# Patient Record
Sex: Male | Born: 1960 | Race: White | Hispanic: No | Marital: Single | Smoking: Current every day smoker
Health system: Southern US, Community
[De-identification: ages and names within clinical notes are randomized; demographics above are authoritative.]

## PROBLEM LIST (undated history)

## (undated) DIAGNOSIS — Z973 Presence of spectacles and contact lenses: Secondary | ICD-10-CM

## (undated) DIAGNOSIS — E785 Hyperlipidemia, unspecified: Secondary | ICD-10-CM

## (undated) DIAGNOSIS — J189 Pneumonia, unspecified organism: Secondary | ICD-10-CM

## (undated) DIAGNOSIS — I2510 Atherosclerotic heart disease of native coronary artery without angina pectoris: Secondary | ICD-10-CM

## (undated) DIAGNOSIS — J449 Chronic obstructive pulmonary disease, unspecified: Secondary | ICD-10-CM

## (undated) DIAGNOSIS — R0600 Dyspnea, unspecified: Secondary | ICD-10-CM

## (undated) DIAGNOSIS — F419 Anxiety disorder, unspecified: Secondary | ICD-10-CM

## (undated) DIAGNOSIS — IMO0001 Reserved for inherently not codable concepts without codable children: Secondary | ICD-10-CM

## (undated) DIAGNOSIS — K219 Gastro-esophageal reflux disease without esophagitis: Secondary | ICD-10-CM

## (undated) DIAGNOSIS — K029 Dental caries, unspecified: Secondary | ICD-10-CM

## (undated) DIAGNOSIS — I219 Acute myocardial infarction, unspecified: Secondary | ICD-10-CM

## (undated) DIAGNOSIS — I10 Essential (primary) hypertension: Secondary | ICD-10-CM

## (undated) DIAGNOSIS — I499 Cardiac arrhythmia, unspecified: Secondary | ICD-10-CM

## (undated) DIAGNOSIS — W3400XA Accidental discharge from unspecified firearms or gun, initial encounter: Secondary | ICD-10-CM

## (undated) DIAGNOSIS — Z8673 Personal history of transient ischemic attack (TIA), and cerebral infarction without residual deficits: Secondary | ICD-10-CM

## (undated) DIAGNOSIS — I639 Cerebral infarction, unspecified: Secondary | ICD-10-CM

## (undated) DIAGNOSIS — R319 Hematuria, unspecified: Secondary | ICD-10-CM

## (undated) DIAGNOSIS — M25569 Pain in unspecified knee: Secondary | ICD-10-CM

## (undated) DIAGNOSIS — N401 Enlarged prostate with lower urinary tract symptoms: Secondary | ICD-10-CM

## (undated) DIAGNOSIS — R0609 Other forms of dyspnea: Secondary | ICD-10-CM

## (undated) DIAGNOSIS — Z87828 Personal history of other (healed) physical injury and trauma: Secondary | ICD-10-CM

## (undated) DIAGNOSIS — C679 Malignant neoplasm of bladder, unspecified: Secondary | ICD-10-CM

## (undated) DIAGNOSIS — R200 Anesthesia of skin: Secondary | ICD-10-CM

## (undated) HISTORY — PX: CARDIAC CATHETERIZATION: SHX172

## (undated) HISTORY — PX: INGUINAL HERNIA REPAIR: SUR1180

## (undated) HISTORY — PX: LEG SURGERY: SHX1003

## (undated) HISTORY — PX: SHOULDER ARTHROSCOPY: SHX128

## (undated) HISTORY — PX: HERNIA REPAIR: SHX51

## (undated) HISTORY — DX: Atherosclerotic heart disease of native coronary artery without angina pectoris: I25.10

## (undated) HISTORY — PX: EYE SURGERY: SHX253

## (undated) HISTORY — DX: Anxiety disorder, unspecified: F41.9

## (undated) HISTORY — DX: Hyperlipidemia, unspecified: E78.5

## (undated) HISTORY — DX: Chronic obstructive pulmonary disease, unspecified: J44.9

## (undated) HISTORY — DX: Cerebral infarction, unspecified: I63.9

## (undated) SURGERY — Surgical Case
Anesthesia: *Unknown

---

## 1978-01-05 HISTORY — PX: LEG SURGERY: SHX1003

## 2001-11-24 ENCOUNTER — Inpatient Hospital Stay (HOSPITAL_COMMUNITY): Admission: AD | Admit: 2001-11-24 | Discharge: 2001-11-25 | Payer: Self-pay | Admitting: Cardiology

## 2002-05-01 ENCOUNTER — Inpatient Hospital Stay (HOSPITAL_COMMUNITY): Admission: AD | Admit: 2002-05-01 | Discharge: 2002-05-02 | Payer: Self-pay | Admitting: Cardiology

## 2002-05-02 ENCOUNTER — Encounter: Payer: Self-pay | Admitting: Cardiology

## 2003-06-29 ENCOUNTER — Inpatient Hospital Stay (HOSPITAL_COMMUNITY): Admission: AD | Admit: 2003-06-29 | Discharge: 2003-07-03 | Payer: Self-pay | Admitting: Cardiology

## 2004-03-13 ENCOUNTER — Ambulatory Visit: Payer: Self-pay | Admitting: Cardiology

## 2004-10-15 ENCOUNTER — Ambulatory Visit: Payer: Self-pay | Admitting: Cardiology

## 2007-01-06 HISTORY — PX: CARDIAC CATHETERIZATION: SHX172

## 2008-07-06 ENCOUNTER — Emergency Department (HOSPITAL_COMMUNITY): Admission: EM | Admit: 2008-07-06 | Discharge: 2008-07-06 | Payer: Self-pay | Admitting: Emergency Medicine

## 2009-02-20 ENCOUNTER — Ambulatory Visit: Payer: Self-pay | Admitting: Cardiology

## 2010-04-13 LAB — PROTIME-INR: Prothrombin Time: 14.5 seconds (ref 11.6–15.2)

## 2010-04-13 LAB — DIFFERENTIAL
Basophils Absolute: 0.1 10*3/uL (ref 0.0–0.1)
Lymphocytes Relative: 26 % (ref 12–46)
Neutro Abs: 6.5 10*3/uL (ref 1.7–7.7)

## 2010-04-13 LAB — POCT I-STAT, CHEM 8
BUN: 24 mg/dL — ABNORMAL HIGH (ref 6–23)
Creatinine, Ser: 1.1 mg/dL (ref 0.4–1.5)
Glucose, Bld: 86 mg/dL (ref 70–99)
Hemoglobin: 15.3 g/dL (ref 13.0–17.0)
Potassium: 3.6 mEq/L (ref 3.5–5.1)
Sodium: 141 mEq/L (ref 135–145)
TCO2: 27 mmol/L (ref 0–100)

## 2010-04-13 LAB — CBC
Hemoglobin: 15 g/dL (ref 13.0–17.0)
Platelets: 249 10*3/uL (ref 150–400)
RDW: 13.4 % (ref 11.5–15.5)
WBC: 10.7 10*3/uL — ABNORMAL HIGH (ref 4.0–10.5)

## 2010-05-06 ENCOUNTER — Other Ambulatory Visit: Payer: Self-pay | Admitting: Neurology

## 2010-05-06 DIAGNOSIS — I639 Cerebral infarction, unspecified: Secondary | ICD-10-CM

## 2010-05-13 ENCOUNTER — Ambulatory Visit (HOSPITAL_COMMUNITY)
Admission: RE | Admit: 2010-05-13 | Discharge: 2010-05-13 | Disposition: A | Discharge status: Home / Self Care | Payer: Medicare Other | Source: Ambulatory Visit | Attending: Neurology | Admitting: Neurology

## 2010-05-13 DIAGNOSIS — I639 Cerebral infarction, unspecified: Secondary | ICD-10-CM

## 2010-05-13 DIAGNOSIS — M6281 Muscle weakness (generalized): Secondary | ICD-10-CM | POA: Insufficient documentation

## 2010-05-13 DIAGNOSIS — I10 Essential (primary) hypertension: Secondary | ICD-10-CM | POA: Insufficient documentation

## 2010-05-13 DIAGNOSIS — Z87891 Personal history of nicotine dependence: Secondary | ICD-10-CM | POA: Insufficient documentation

## 2010-05-13 DIAGNOSIS — I6350 Cerebral infarction due to unspecified occlusion or stenosis of unspecified cerebral artery: Secondary | ICD-10-CM | POA: Insufficient documentation

## 2010-05-23 NOTE — Cardiovascular Report (Signed)
NAME:  DIESEL, LINA                        ACCOUNT NO.:  [id number]   MEDICAL RECORD NO.:  [id number]                   PATIENT TYPE:  INP   LOCATION:  4727                                 FACILITY:  [facility]   PHYSICIAN:  Jonelle Sidle, M.D. [facility]        DATE OF BIRTH:  03-16-1960   DATE OF PROCEDURE:  DATE OF DISCHARGE:                              CARDIAC CATHETERIZATION   PRIMARY CARE PHYSICIAN:  Wende Crease, M.D.   Corinda Gubler CARDIOLOGIST:  Jonelle Sidle, M.D.   INDICATIONS FOR PROCEDURE:  The patient is a 50 year old male with a history  of tobacco abuse and significant family history of premature coronary artery  disease.  He was admitted with new onset chest discomfort and underwent a  Cardiolite study which suggested potential inferior wall ischemia with an  ejection fraction of 49%.  He is now referred for definitive evaluation with  a cardiac catheterization to define the coronary anatomy.   PROCEDURE PERFORMED:  1. Left heart catheterization.  2. Selectively coronary angiography.  3. Left ventriculography.   ACCESS AND EQUIPMENT:  The area about the right femoral artery was  anesthetized with 1% lidocaine and a #6 French sheath was placed in the  right femoral artery via the modified Seldinger technique.  Standard  preformed #6 [place] and JR4 catheters were used for selective coronary  angiography and an angled pigtail catheter was used for left heart  catheterization and left ventriculography.  All exchanges were made over a  wire, and the patient tolerated the procedure well without immediate  complications.   HEMODYNAMICS:  1. Left ventricle:  151/20 mmHg.  2. Aorta:  151/68 mmHg.   ANGIOGRAPHIC FINDINGS:  1. The left main coronary artery is free of significant flow-limiting     coronary atherosclerosis.  2. The left anterior descending is a large caliber vessel with two diagonal     branches, the first of which is bifurcating.  There is no  significant     flow-limiting coronary atherosclerosis noted with only minor luminal     irregularities.  3. The circumflex coronary artery is a large dominant vessel with a large     obtuse marginal branch that supplies most of the lateral wall and a     posterior descending branch.  There is no significant flow-limiting     coronary atherosclerosis noted within this system.  4. The right coronary artery is a small nondominant vessel with a 40%     proximal stenosis.  5. Left ventriculography was performed in the RAO projection and revealed an     ejection fraction of 50-55% without focal wall motion abnormalities and     no significant mitral regurgitation.   DIAGNOSES:  1. Nonobstructive coronary atherosclerosis.  There is approximately 40%     stenosis of a small nondominant right coronary artery and only minor     irregularities in the dominant left system.  2. Left ventricular ejection  fraction of 50-55% with no significant mitral     regurgitation.   RECOMMENDATIONS:  Continue risk factor modification but also follow up on  liver function tests, alkaline phosphatase and amylase levels, and continue  proton pump inhibitor.  I furthermore discussed smoking cessation with the  patient.                                               Jonelle Sidle, M.D. [facility]    SGM/MEDQ  D:  11/24/2001  T:  11/24/2001  Job:  [id number]

## 2010-05-23 NOTE — Cardiovascular Report (Signed)
NAME:  Mark Waller, Mark Waller                        ACCOUNT NO.:  [id number]   MEDICAL RECORD NO.:  [id number]                   PATIENT TYPE:  INP   LOCATION:  4734                                 FACILITY:  [facility]   PHYSICIAN:  Arturo Morton. Riley Kill, M.D. [facility]         DATE OF BIRTH:  1960/07/26   DATE OF PROCEDURE:  07/02/2003  DATE OF DISCHARGE:                              CARDIAC CATHETERIZATION   PROCEDURES PERFORMED:  1. Left heart catheterization.  2. Selective coronary arteriography.  3. Selective left ventriculography.   CARDIOLOGIST:  Arturo Morton. Riley Kill, M.D.   INDICATIONS:  Mr. Hanrahan is a 50 year old who presented with chest pain.  The current study was done to assess coronary anatomy.   DESCRIPTION OF THE PROCEDURE:  The patient was quite anxious coming to the  laboratory.  He was prepped and draped in the usual fashion.  Through an  anterior puncture following local anesthesia the right femoral artery was  easily entered and a 6 French sheath was placed.  Central aortic and left  ventricular pressures were measured with a pigtail.  Ventriculography was  performed in the RAO projection.  Coronary arteriography was performed with  standard angiographic projections.   The patient tolerated the procedure well.  He was taken to the holding area  in satisfactory clinical condition.   HEMODYNAMIC DATA:  1. Central aorta 133/73 and mean 94.  2. Left ventricle 118/16.  3. No gradient on pullback across the aortic valve.   Left Main Coronary Artery:  The left main coronary artery was free of  critical disease,   Left Anterior Descending Artery:  The left anterior descending artery  coursed to the apex.  The LAD and its branches were free of significant  disease.  There  were mild luminal irregularity of 20-30% at most in the  midvessel.   Circumflex:  The circumflex provided a large marginal branch and then was a  dominant vessel.  Just after the takeoff of the large marginal  branch was  area of mild segmental irregularity measuring at most 20-30% luminal  reduction.  The AV circumflex was without critical disease.   Right Coronary Artery:  The right coronary artery was a nondominant vessel  and free of critical disease.   VENTRICULOGRAPHIC DATA:  Ventriculography:  Ventriculography was performed  in the RAO projection.  There was borderline mild hypokinesis.  Ejection  fraction was calculated at 58% on the sinus peak.   CONCLUSION:  1. Preserved left ventricular function.  2. Minor luminal irregularities without significant high-grade stenosis.   RECOMMENDATIONS:  1. Discontinue smoking.  2. Discontinue alcohol.  3. Continue to follow up with Dr. Diona Browner and Dr. Doyne Keel.  Arturo Morton. Riley Kill, M.D. [facility]    TDS/MEDQ  D:  07/02/2003  T:  07/02/2003  Job:  [id number]   cc:   [facility] Laboratory   Jonelle Sidle, M.D. [facility]   Elby Beck, M.D.

## 2010-05-23 NOTE — H&P (Signed)
NAME:  Mark Waller, Mark Waller                        ACCOUNT NO.:  [id number]   MEDICAL RECORD NO.:  [id number]                   PATIENT TYPE:  INP   LOCATION:  4737                                 FACILITY:  [facility]   PHYSICIAN:  Creta Levin, M.D. [facility]      DATE OF BIRTH:  06-11-1960   DATE OF ADMISSION:  05/01/2002  DATE OF DISCHARGE:                                HISTORY & PHYSICAL   PRIMARY CARE PHYSICIAN:  Dr. Doyne Keel in [place], [place].   CHIEF COMPLAINT:  Chest pain and shortness of breath.   HISTORY OF PRESENT ILLNESS:  The patient is a 50 year old male with a  history of nonobstructive coronary disease who was in his usual state of  health until 5 o'clock p.m. on April 30, 2002, when he developed 8/10  substernal chest pain that was sharp in quality, radiated to his left upper  extremity, and was associated with dyspnea, nausea, and diaphoresis.  His  symptoms lasted about two and a half hours until he called EMS and was  brought to an outside hospital where he was made pain-free with  nitroglycerin and morphine.  He has had no episodes of chest discomfort  since his hospitalization here in November of 2003 when a catheterization  showed a 40% lesion in a nondominant RCA but otherwise no significant  abnormalities.  He denies orthopnea, PND, or edema.  He has had no  palpitations, presyncope, or syncope.  He does have a baseline dyspnea on  exertion of approximately 100 yards but states that he also needs to stop  because of numbness in his feet.   PAST MEDICAL HISTORY:  CAD.  Catheterization November 2003, revealing a 40%  proximal RCA (nondominant).  There was no disease noted in the left system.  The EF on E-gram was 50 to 55% at that time.   SOCIAL HISTORY:  [place]   HABITS:  He has a 60+ pack-year smoking history, now smokes three packs per  day.  He drinks six to eight beers per week but denies illicit drug use.   FAMILY  HISTORY:  This is significant for an MI in his father at age 28.  His  father died in his 82s.   REVIEW OF SYMPTOMS:  This was positive for headache which was felt prior to  first receiving nitroglycerin.  The chest pain, shortness of breath and  dyspnea on exertion are mentioned in the HPI.  He does have some numbness in  his feet and lower extremities when he walks.  He is also complaining of  some nausea.  Otherwise his full review of systems was noncontributory.   PHYSICAL EXAMINATION:  GENERAL APPEARANCE:  He is an anxious male who  appears older than his stated age but is in no acute distress.  VITAL SIGNS:  Temperature 97.7, blood pressure 122/71, heart rate 42 which  is regular, respiratory rate is 20.  His SAO2  is 97% on room air.  HEENT:  Significant only for poor dentition.  NECK:  No JVD or bruits.  LUNGS:  There were scattered fine crackles and mild wheezing throughout.  CARDIOVASCULAR:  Irregular bradycardia without gallop or murmur.  ABDOMEN:  Soft, nondistended and nontender.  RECTAL:  Heme-negative at the outside hospital.  EXTREMITIES:  No clubbing, cyanosis, or edema.  NEUROLOGIC:  Nonfocal.   Chest x-ray at the outside hospital revealed no active disease.   His ECG at the outside hospital revealed a sinus bradycardia at 54 with a  borderline first degree AV block but no evidence of ischemia.   LABORATORY DATA:  The labs at the outside hospital showed white count 10.2,  hemoglobin 15.3 and hematocrit 44.4, platelets 316.  Sodium 136, potassium  4, chloride 106, bicarb 26, BUN 16, creatinine 0.9, glucose 116.  CK was 74,  MB 0.01.  PTT was 24.3, PT 12.6, INR was 1.0.   ASSESSMENT AND PLAN:  Chest pain.  He has a previous admission with  noncardiac chest pain and what sounds like a pretty similar story.  It is  atypical in that the quality is sharp otherwise it is a pretty reasonable  story for cardiac chest pain.  For this reason, we will continue to monitor  him  on telemetry.  We can continue the heparin that was started at the  outside hospital but I do not feel there is a need for a 2B3A inhibitor at  this time.  We were unable to give a beta-blocker given his bradycardia but  we will attempt to give a low dose of metoprolol if his heart rate comes  back up.  He was on Plavix for reasons that are not entirely clear and  instead of this, we will continue aspirin.  He is not on statin and we can  start this and check this lipid panel.  We can also start an ACE inhibitor.   I discussed smoking cessation with the patient.  He is not interested at  this time.  In fact, he wants to leave the floor to go smoke a cigarette  right now.  We have advised him against this while he is under evaluation  for a possible acute coronary syndrome.  He may decide to go off the floor  against medical advice anyway.  I will give him some Ativan and also place a  Nicotine patch with the hope of reducing his cravings.  Otherwise, we are  going to need a multifactorial approach.                                               Creta Levin, M.D. [facility]    RPK/MEDQ  D:  05/01/2002  T:  05/01/2002  Job:  [id number]   cc:   Dr. Aundra Millet, [place]
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
## 2010-05-23 NOTE — Discharge Summary (Signed)
   NAME:  Mark Waller, Mark Waller                        ACCOUNT NO.:  [id number]   MEDICAL RECORD NO.:  [id number]                   PATIENT TYPE:  INP   LOCATION:  4737                                 FACILITY[facility]  [facility]   PHYSICIAN:  Guy Franco, P.A. [facility]                DATE OF BIRTH:  Jun 07, 1960   DATE OF ADMISSION:  05/01/2002  DATE OF DISCHARGE:  05/02/2002                           DISCHARGE SUMMARY - REFERRING   DISCHARGE DIAGNOSES:  1. Chest discomfort, resolved.  2. Known coronary artery disease, nonobstructive, in November of 2003.  3. Alcohol abuse.  4. Tobacco abuse.  5. Mild elevation of liver function tests.   HOSPITAL COURSE:  Mr. Zurawski is a 50 year old male patient who was admitted  in November of 2003 and underwent cardiac catheterization that revealed  nonobstructive coronary artery disease with a nondominant, 40% proximal RCA  lesion.  He was readmitted on May 01, 2002, with recurrent substernal  chest pain essentially; he underwent an adenosine Cardiolite that revealed  an EF of 51%, essentially revealed no acute abnormalities.  Labs obtained  during the patient's hospital stay included a white count of 9.3, hemoglobin  14.8, hematocrit of 44.6, platelets 302, AST 53, and ALT 69 with all other  CMET results as being normal.  Total cholesterol was 196, triglycerides 65,  HDL 43, LDL 141.  Cardiac isoenzymes negative.   The patient came into the hospital on Plavix, and this was continued; this  had been started as an outpatient, and the patient had been describing dizzy  spells with loss of vision and numbness in the left arm.  Apparently, this  was for some type of stroke prevention as an outpatient.   Once his studies were back, the patient was felt to be ready for discharge  to home, and he was discharged to home in stable condition.  He is to  continue his home medications which include Plavix as well as a baby  aspirin.  May use __________ Tylenol as needed.   Remain on a low-fat, low-salt, low-  cholesterol diet, and he is to follow up with Dr. Doyne Keel in the morning.  He is to call him for a followup appointment.                                               Guy Franco, P.A. [facility]    LB/MEDQ  D:  06/10/2002  T:  06/10/2002  Job:  [id number]   cc:   Sidonie Dickens, M.D. [facility]

## 2010-05-23 NOTE — Discharge Summary (Signed)
NAME:  Mark Waller, Mark Waller                        ACCOUNT NO.:  [id number]   MEDICAL RECORD NO.:  [id number]                   PATIENT TYPE:  INP   LOCATION:  4727                                 FACILITY[facility]  [facility]   PHYSICIAN:  Joellyn Rued, P.A. [facility]              DATE OF BIRTH:  [birth year]   DATE OF ADMISSION:  11/24/2001  DATE OF DISCHARGE:  11/25/2001                           DISCHARGE SUMMARY - REFERRING   HISTORY OF PRESENT ILLNESS:  The patient is a 50 year old white male who  presented to [facility] complaining of sudden onset of chest  tightness that has lasted several hours by his account.  He did not have any  associated shortness of breath but he did describe a somewhat pleuritic  component.  He denied any diaphoresis, nausea, or vomiting.  He has not had  any problems with exertional chest discomfort and he does admit to strenuous  activity.  His history is notable for tobacco use in early family history.  At [facility], ECG showed normal sinus rhythm, sinus bradycardia, and  nonspecific ST-T wave changes.  CK total MBs and troponins were negative for  myocardial infarction.  Triglycerides 190, total cholesterol 190, HDL 41,  LDL 111.  Sodium 141, potassium 4.0, amylase 54, BUN 12, creatinine 1.0,  glucose 90, alkaline phosphatase was slightly elevated at 113, SGOT 84.  Hemoglobin and hematocrit was 15.2 and 44.9, respectively, normal indices,  platelets 352, WBC 14.4.  Alkaline phosphatase repeated on November 21 was  within normal limits at 94.   HOSPITAL COURSE:  The patient was transferred to [facility] for  cardiac catheterization to evaluate his chest discomfort.  He ruled out for  myocardial infarction at [facility].  On November 24, 2001, cardiac  catheterization was performed by Dr. Diona Browner. This revealed an ejection  fraction of 50-55%, no wall motion abnormalities.  He had a very small  nondominant RCA that had a 40% proximal lesion.   The remainder of his  arteries were free from coronary artery disease.  He was started on Protonix  and by November 21 catheterization site was intact and he was ambulating  without difficulty.  Dr. Eden Emms felt that he could be discharged home and  followed up by his primary care physician.   DISCHARGE DIAGNOSES:  1. Noncardiac chest discomfort.  2. Tobacco use.   DISPOSITION:  He is discharged home.   DISCHARGE MEDICATIONS:  1. Protonix 40 mg daily  2. Wellbutrin 150 mg b.i.d. for seven weeks.  3. Baby aspirin 81 mg daily   He is advised no lifting, driving, sexual activity or heavy exertion for two  days.  Maintain low salt, fat cholesterol diet.  If he has any problems with  his catheterization site, he was asked to call our office immediately.  He  was advised no smoking or tobacco products and arranging a two week  appointment with Dr. Doyne Keel to follow  up on cardiac risk factor  modification and establish himself with a primary care MD.                                               Joellyn Rued, P.A. [facility]    EW/MEDQ  D:  11/25/2001  T:  11/25/2001  Job:  [id number]

## 2010-11-14 IMAGING — CT CT HEAD W/O CM
1 of 2 series · 13 of 30 positions shown, 17 images · non-contrast
Comparison: None

CLINICAL DATA: Recent history of TIAs, now with headache and left-
sided numbness and weakness

CT HEAD WITHOUT CONTRAST
TECHNIQUE: Contiguous axial images were obtained from the base of
the skull through the vertex without contrast.

[Series 2: brain · axial · 0.49mm/px · z∈[+138,+262]mm · 13 of 28 slices shown, 17 images]
[im 2/28  brain]
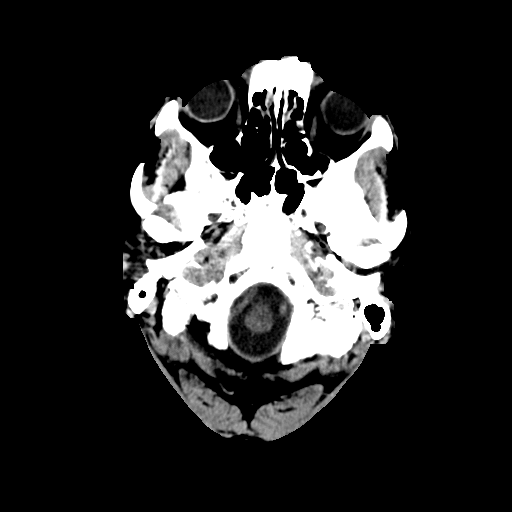
[im 2/28  bone]
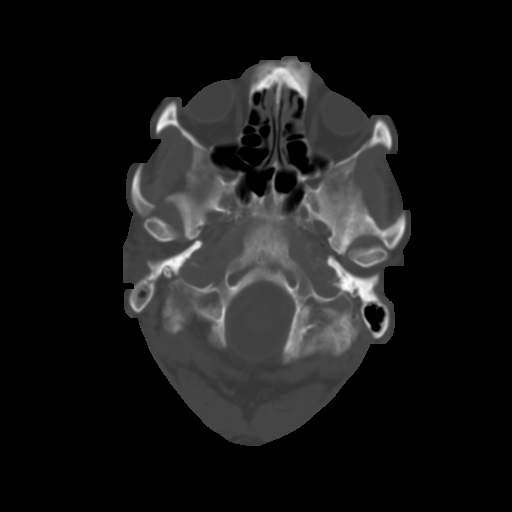
[im 4/28  brain]
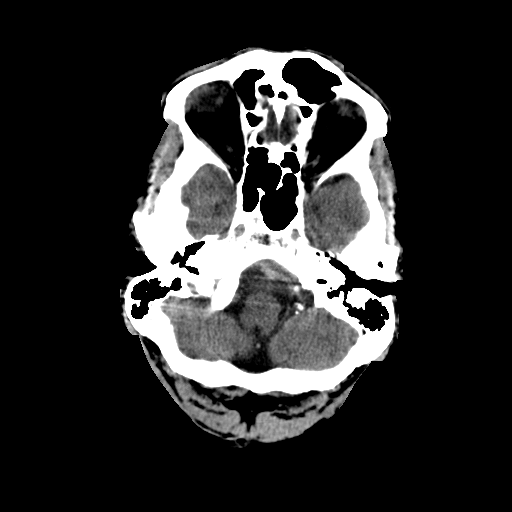
[im 6/28  brain]
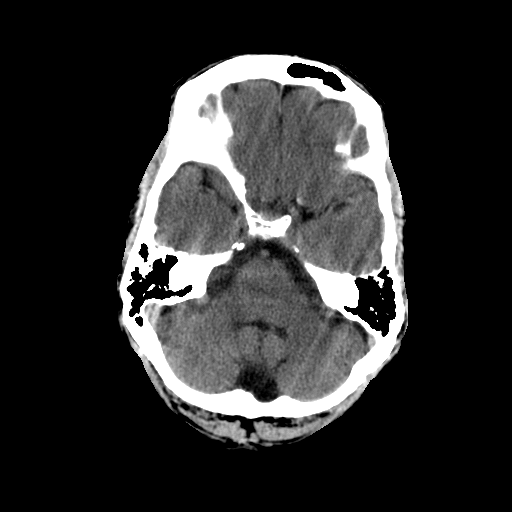
[im 8/28  brain]
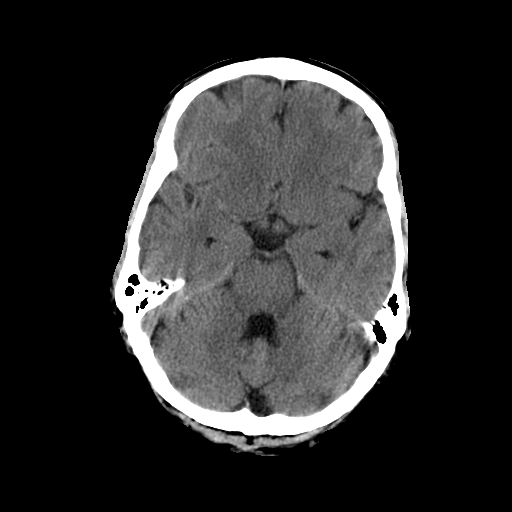
[im 10/28  brain]
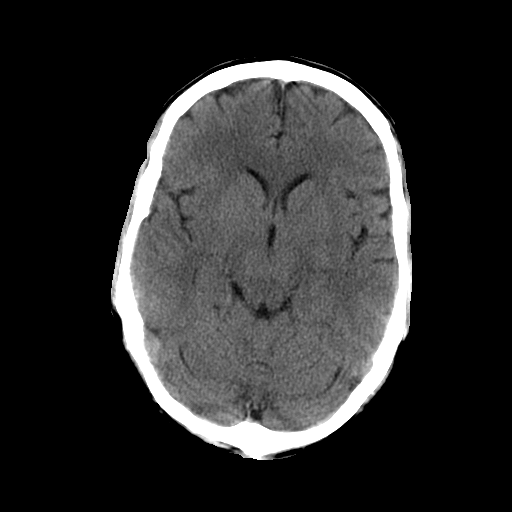
[im 10/28  bone]
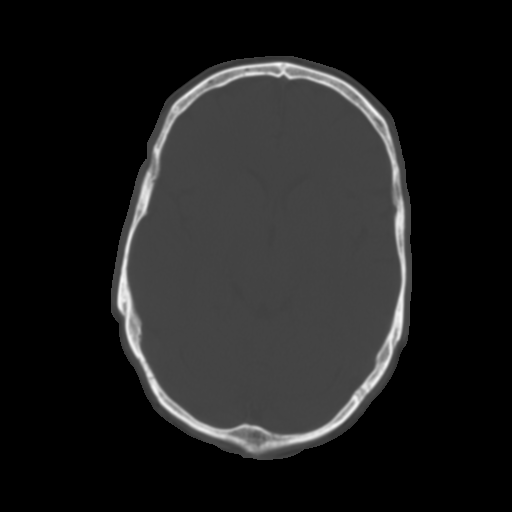
[im 12/28  brain]
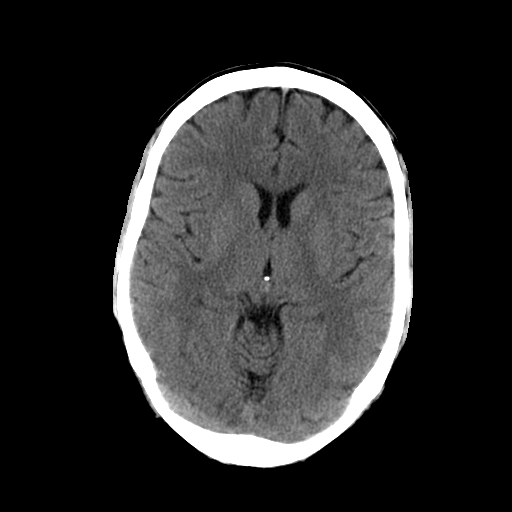
[im 14/28  brain]
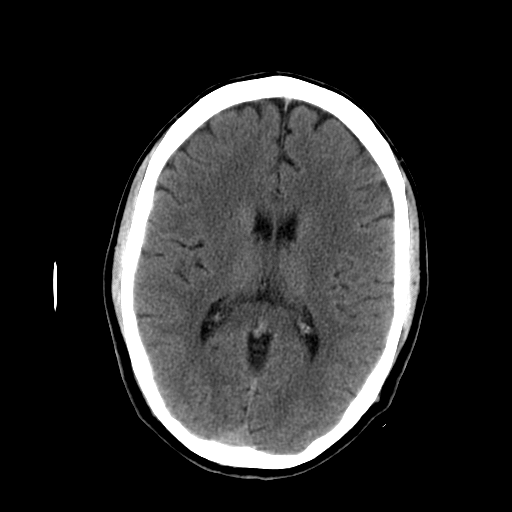
[im 16/28  brain]
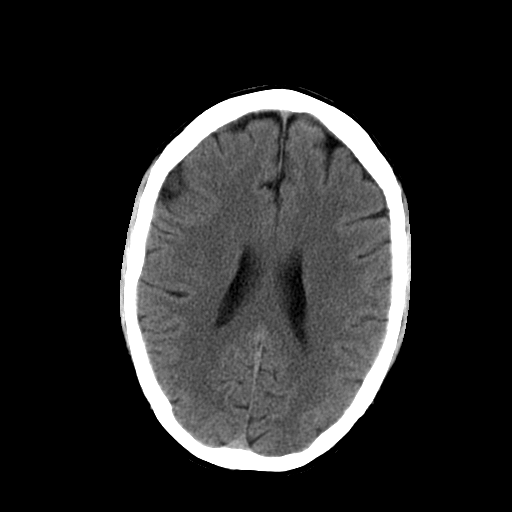
[im 18/28  brain]
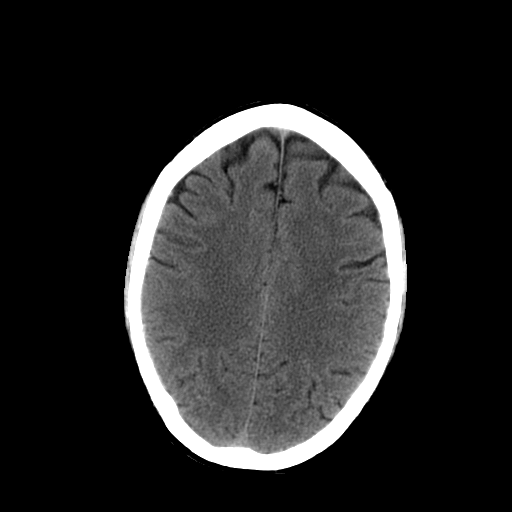
[im 18/28  bone]
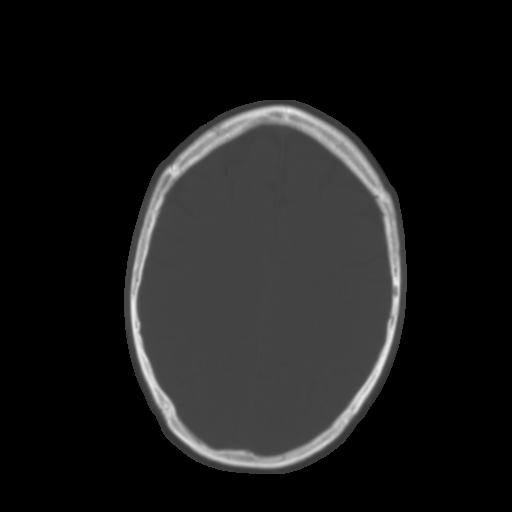
[im 20/28  brain]
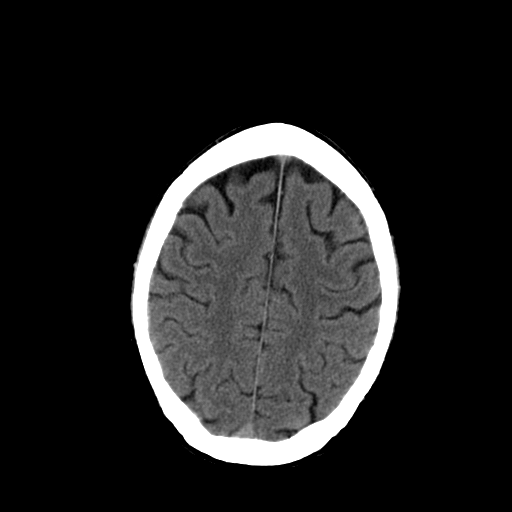
[im 22/28  brain]
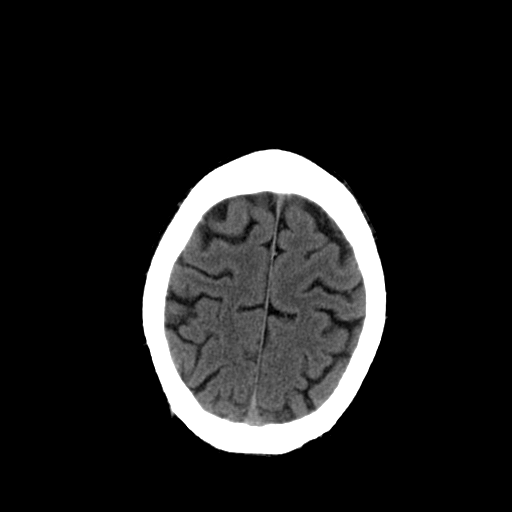
[im 24/28  brain]
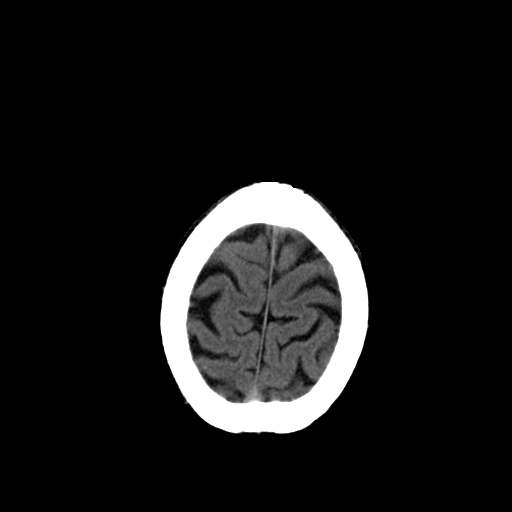
[im 26/28  brain]
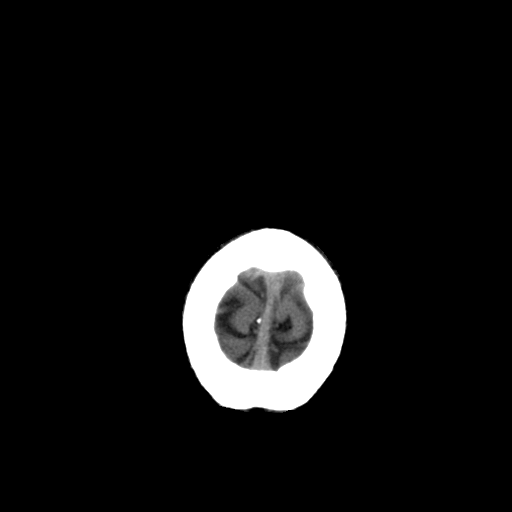
[im 26/28  bone]
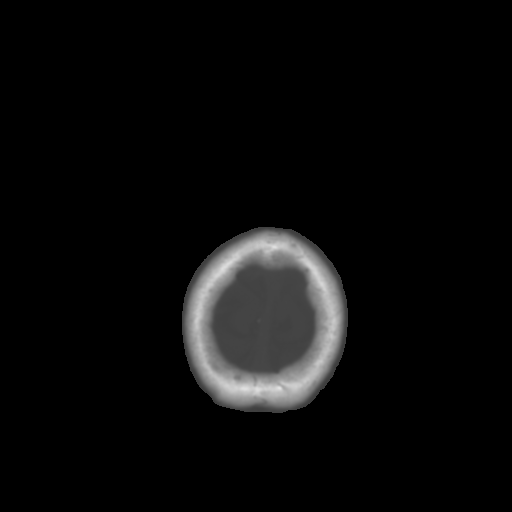

[13 of 30 positions shown; findings below may reference images not displayed]

FINDINGS: The ventricular system is normal in size and
configuration.  The septum is in a normal midline position.  The
fourth ventricle and basilar cisterns appear normal.  No
hemorrhage, mass lesion, or acute infarction is seen.  Mild ethmoid
sinus disease is noted.  No calvarial abnormality is seen.
IMPRESSION: No acute intracranial abnormality.  Mild ethmoid sinus disease.

## 2010-12-05 DIAGNOSIS — R079 Chest pain, unspecified: Secondary | ICD-10-CM

## 2010-12-08 ENCOUNTER — Encounter: Payer: Self-pay | Admitting: Cardiology

## 2010-12-09 ENCOUNTER — Ambulatory Visit (INDEPENDENT_AMBULATORY_CARE_PROVIDER_SITE_OTHER): Payer: Medicare Other | Admitting: Cardiology

## 2010-12-09 ENCOUNTER — Encounter: Payer: Self-pay | Admitting: Cardiology

## 2010-12-09 VITALS — BP 146/80 | HR 79 | Resp 18 | Ht 72.0 in | Wt 174.0 lb

## 2010-12-09 DIAGNOSIS — Z8673 Personal history of transient ischemic attack (TIA), and cerebral infarction without residual deficits: Secondary | ICD-10-CM

## 2010-12-09 DIAGNOSIS — F17200 Nicotine dependence, unspecified, uncomplicated: Secondary | ICD-10-CM

## 2010-12-09 DIAGNOSIS — I2510 Atherosclerotic heart disease of native coronary artery without angina pectoris: Secondary | ICD-10-CM

## 2010-12-09 DIAGNOSIS — E782 Mixed hyperlipidemia: Secondary | ICD-10-CM

## 2010-12-09 DIAGNOSIS — Z720 Tobacco use: Secondary | ICD-10-CM

## 2010-12-09 DIAGNOSIS — I10 Essential (primary) hypertension: Secondary | ICD-10-CM

## 2010-12-09 NOTE — Assessment & Plan Note (Signed)
We discussed the critical importance of smoking cessation.

## 2010-12-09 NOTE — Assessment & Plan Note (Signed)
Blood pressure is mildly elevated today. Continue regular followup with Dr. Loney Hering.

## 2010-12-09 NOTE — Patient Instructions (Signed)
   Echo If the results of your test are normal or stable, you will receive a letter.  If they are abnormal, the nurse will contact you by phone. Your physician wants you to follow up in: 6 months.  You will receive a reminder letter in the mail one-two months in advance.  If you don't receive a letter, please call our office to schedule the follow up appointment  

## 2010-12-09 NOTE — Progress Notes (Signed)
Clinical Summary Mark Waller is a 50 y.o.male referred for cardiology evaluation by Dr. Loney Hering. He was seen by our practice several years ago, underwent previous cardiac catheterizations demonstrating only mild nonobstructive disease.  Records indicate recent ER visit at [facility] with chest pain, also some right upper extremity numbness, headache. He was noted to be hemodynamically stable. 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 No new stroke identified.Mark Waller His chest pain was described as noncardiac.  Dr. Loney Hering referred him for a followup Lexiscan Cardiolite on 11/30 which demonstrated no diagnostic ST changes, probable attenuation artifact affecting the inferior and apical distribution, LVEF 54%, but increased end-diastolic volume. No significant TID noted.  Mark Waller states that he was previously followed by a neurosurgeon at [facility]. He states he was told that he could not take aspirin.  He denies any further chest pain symptoms since November. Has NYHA class II dyspnea on exertion, no orthopnea or PND.  Continues to smoke, and we discussed smoking cessation. We also reviewed his stress test results, his known diagnosis of CAD based on previous testing, and further risk factor modification strategies including blood pressure and lipid control.   Allergies  Allergen Reactions  . Penicillins Rash    Medication list reviewed.  Past Medical History  Diagnosis Date  . Coronary atherosclerosis of native coronary artery     Mild nonobstructive disease at catheterization 2005  . Hyperlipidemia   . Anxiety   . COPD (chronic obstructive pulmonary disease)   . Stroke     History of prior right medullary hemorrhage - previously followed at [facility]    Past Surgical History  Procedure Date  . Hernia repair   . Leg surgery     Family History    Problem Relation Age of Onset  . Coronary artery disease Father   . Coronary artery disease Sister     Social History Mark Waller reports that he has been smoking Cigarettes.  He has never used smokeless tobacco. Mark Waller reports that he does not drink alcohol.  Review of Systems No reported palpitations. Occasional dizziness and headaches. No syncope. Reports stable appetite. No obvious claudication. Otherwise negative.  Physical Examination Filed Vitals:   12/09/10 1304  BP: 146/80  Pulse: 79  Resp: 18   Chronically ill-appearing male, in no acute distress, no active chest pain. HEENT: Conjunctiva and lids normal, oropharynx with poor dentition. Neck: Supple, no elevated JVP or carotid bruits. No thyromegaly. Lungs: Clear to auscultation, nonlabored. Decreased overall breath sounds. Cardiac: Regular rate and rhythm, S4 noted, soft systolic murmur, no S3 or rub. Abdomen: Soft, nontender, no bruits. Skin: Warm and dry. Extremities: No pitting edema, distal pulses 1-2+. Musculoskeletal: No gross deformities. Neuropsychiatric: Alert and oriented x3, moves all extremities equally.     Problem List and Plan

## 2010-12-09 NOTE — Assessment & Plan Note (Signed)
Limited information suggests prior history of spontaneous right medullary hemorrhagic stroke, previously followed at [facility]. Records will be requested. Patient states he was told that he could not take aspirin.

## 2010-12-09 NOTE — Assessment & Plan Note (Signed)
On statin therapy, followed by Dr. Loney Hering. LDL should be under 100 at least.

## 2010-12-09 NOTE — Assessment & Plan Note (Signed)
Patient with history of nonobstructive disease based on prior cardiac catheterization, with recent Cardiolite demonstrating no frank ischemia, probable attenuation artifact affecting the inferior wall and apex. LVEF was 54%. He has had no further chest pain symptoms since November. We discussed observation, warning signs, and risk factor modification. Cardiolite did suggest left ventricular dilatation at baseline, and we will plan a 2-D echocardiogram to better assess cardiac structure and function. Aspirin is not being started with apparent history of previous spontaneous hemorrhagic stroke.

## 2010-12-10 ENCOUNTER — Encounter: Payer: Self-pay | Admitting: *Deleted

## 2010-12-10 ENCOUNTER — Other Ambulatory Visit: Payer: Medicare Other | Admitting: *Deleted

## 2010-12-17 DIAGNOSIS — R079 Chest pain, unspecified: Secondary | ICD-10-CM

## 2011-01-01 ENCOUNTER — Other Ambulatory Visit: Payer: Medicare Other | Admitting: *Deleted

## 2011-01-06 HISTORY — PX: COLONOSCOPY W/ POLYPECTOMY: SHX1380

## 2011-01-23 ENCOUNTER — Ambulatory Visit (INDEPENDENT_AMBULATORY_CARE_PROVIDER_SITE_OTHER): Payer: Medicare Other | Admitting: Cardiology

## 2011-01-23 ENCOUNTER — Encounter: Payer: Medicare Other | Admitting: Cardiology

## 2011-01-23 ENCOUNTER — Encounter: Payer: Self-pay | Admitting: Cardiology

## 2011-01-23 VITALS — BP 124/68 | HR 70 | Ht 72.0 in | Wt 175.0 lb

## 2011-01-23 DIAGNOSIS — E782 Mixed hyperlipidemia: Secondary | ICD-10-CM

## 2011-01-23 DIAGNOSIS — I10 Essential (primary) hypertension: Secondary | ICD-10-CM

## 2011-01-23 DIAGNOSIS — I2510 Atherosclerotic heart disease of native coronary artery without angina pectoris: Secondary | ICD-10-CM

## 2011-01-23 NOTE — Assessment & Plan Note (Signed)
Based on most recent cardiac evaluation, plan is to continue medical therapy and observation with previously documented nonobstructive disease at catheterization. Office followup arranged. He is not on aspirin or other antiplatelet therapy with prior history of spontaneous intracranial hemorrhage.

## 2011-01-23 NOTE — Patient Instructions (Signed)
Your physician wants you to follow-up in: 6 months. You will receive a reminder letter in the mail one-two months in advance. If you don't receive a letter, please call our office to schedule the follow-up appointment. Your physician recommends that you continue on your current medications as directed. Please refer to the Current Medication list given to you today. 

## 2011-01-23 NOTE — Assessment & Plan Note (Signed)
Blood pressure is reasonably controlled today. 

## 2011-01-23 NOTE — Progress Notes (Signed)
   Clinical Summary Mark Waller is a 51 y.o.male presenting for followup. He was seen in December. Record review finds admission to [facility] since then with atypical chest pain. He was seen by our practice in consultation and ruled out for MI. Followup echocardiogram showed LVEF 55-60% with normal chamber size, mild aortic regurigtation, mild ascending aortic dilatation (although measurement in report was normal at 33 mm).  He presents today with no complaints of chest pain. He reports compliance with his medications. I reviewed with him the results of his recent cardiac testing during hospitalization. Our plan at this time is to continue observation on medical therapy.   Allergies  Allergen Reactions  . Penicillins Rash    Current Outpatient Prescriptions  Medication Sig Dispense Refill  . budesonide-formoterol (SYMBICORT) 160-4.5 MCG/ACT inhaler Inhale 2 puffs into the lungs 2 (two) times daily.        . diazepam (VALIUM) 5 MG tablet Take 5 mg by mouth 2 (two) times daily as needed.        . folic acid (FOLVITE) 1 MG tablet Take 1 mg by mouth daily.        [name] lisinopril (PRINIVIL,ZESTRIL) 10 MG tablet Take 10 mg by mouth daily.        [name] omeprazole (PRILOSEC) 40 MG capsule Take 40 mg by mouth daily.        . rosuvastatin (CRESTOR) 10 MG tablet Take 10 mg by mouth daily.        [name] tiotropium (SPIRIVA) 18 MCG inhalation capsule Place 18 mcg into inhaler and inhale daily.        . vitamin B-12 (CYANOCOBALAMIN) 1000 MCG tablet Take 1,000 mcg by mouth daily.          Past Medical History  Diagnosis Date  . Coronary atherosclerosis of native coronary artery     Mild nonobstructive disease at catheterization 2005  . Hyperlipidemia   . Anxiety   . COPD (chronic obstructive pulmonary disease)   . Stroke     History of prior right medullary hemorrhage - previously followed at [facility]    Social History Mark Waller reports that he has been smoking Cigarettes.  He has a 10.5 pack-year smoking  history. He has never used smokeless tobacco. Mark Waller reports that he does not drink alcohol.  Review of Systems No palpitations or syncope. Stable appetite. No reported bleeding problems. Otherwise negative.  Physical Examination Filed Vitals:   01/23/11 1519  BP: 124/68  Pulse: 70   Chronically ill-appearing male, in no acute distress, no active chest pain.  HEENT: Conjunctiva and lids normal, oropharynx with poor dentition.  Neck: Supple, no elevated JVP or carotid bruits. No thyromegaly.  Lungs: Clear to auscultation, nonlabored. Decreased overall breath sounds.  Cardiac: Regular rate and rhythm, S4 noted, soft systolic murmur, no S3 or rub.  Abdomen: Soft, nontender, no bruits.  Skin: Warm and dry.  Extremities: No pitting edema, distal pulses 1-2+.     Problem List and Plan

## 2011-01-23 NOTE — Assessment & Plan Note (Signed)
Continued on statin therapy.  ?

## 2011-05-11 ENCOUNTER — Encounter (HOSPITAL_COMMUNITY): Payer: Self-pay | Admitting: Respiratory Therapy

## 2011-05-14 ENCOUNTER — Encounter (HOSPITAL_COMMUNITY)
Admission: RE | Admit: 2011-05-14 | Discharge: 2011-05-14 | Disposition: A | Discharge status: Home / Self Care | Payer: Medicare Other | Source: Ambulatory Visit | Attending: Oral Surgery | Admitting: Oral Surgery

## 2011-05-14 ENCOUNTER — Encounter (HOSPITAL_COMMUNITY): Payer: Self-pay

## 2011-05-14 ENCOUNTER — Encounter (HOSPITAL_COMMUNITY)
Admission: RE | Admit: 2011-05-14 | Discharge: 2011-05-14 | Disposition: A | Discharge status: Home / Self Care | Payer: Medicare Other | Source: Ambulatory Visit | Attending: Anesthesiology | Admitting: Anesthesiology

## 2011-05-14 HISTORY — DX: Essential (primary) hypertension: I10

## 2011-05-14 HISTORY — DX: Dental caries, unspecified: K02.9

## 2011-05-14 HISTORY — DX: Reserved for inherently not codable concepts without codable children: IMO0001

## 2011-05-14 HISTORY — DX: Acute myocardial infarction, unspecified: I21.9

## 2011-05-14 HISTORY — DX: Gastro-esophageal reflux disease without esophagitis: K21.9

## 2011-05-14 HISTORY — DX: Accidental discharge from unspecified firearms or gun, initial encounter: W34.00XA

## 2011-05-14 HISTORY — DX: Pain in unspecified knee: M25.569

## 2011-05-14 LAB — CBC
HCT: 46.8 % (ref 39.0–52.0)
Hemoglobin: 16 g/dL (ref 13.0–17.0)
MCH: 33.5 pg (ref 26.0–34.0)
MCHC: 34.2 g/dL (ref 30.0–36.0)
MCV: 97.9 fL (ref 78.0–100.0)
RDW: 13.2 % (ref 11.5–15.5)

## 2011-05-14 LAB — BASIC METABOLIC PANEL
BUN: 12 mg/dL (ref 6–23)
Chloride: 102 mEq/L (ref 96–112)
Creatinine, Ser: 0.91 mg/dL (ref 0.50–1.35)
Glucose, Bld: 80 mg/dL (ref 70–99)
Potassium: 3.8 mEq/L (ref 3.5–5.1)

## 2011-05-14 NOTE — Pre-Procedure Instructions (Signed)
[address] Leon[address]  05/14/2011   Your procedure is scheduled on:  Monday, May 13   Report to [facility] at 0830 AM.  Call this number if you have problems the morning of surgery: [phone]   Remember:   Do not eat food:After Midnight.  May have clear liquids: up to 4 Hours before arrival.  Clear liquids include soda, tea, black coffee, apple or grape juice, broth.  Take these medicines the morning of surgery with A SIP OF WATER: *Inhaler if needed, Spiriva, Valium,omeprazole,**   Do not wear jewelry, make-up or nail polish.  Do not wear lotions, powders, or perfumes. You may wear deodorant.  Do not shave 48 hours prior to surgery.  Do not bring valuables to the hospital.  Contacts, dentures or bridgework may not be worn into surgery.  Leave suitcase in the car. After surgery it may be brought to your room.  For patients admitted to the hospital, checkout time is 11:00 AM the day of discharge.   Patients discharged the day of surgery will not be allowed to drive home.  Name and phone number of your driver: *Mother, **  Special Instructions: CHG Shower Use Special Wash: 1/2 bottle night before surgery and 1/2 bottle morning of surgery.   Please read over the following fact sheets that you were given: Pain Booklet, Coughing and Deep Breathing, Surgical Site Infection Prevention and Care and Recovery After Surgery

## 2011-05-15 NOTE — Progress Notes (Signed)
Patient notified of needing to be at short stay 5/13 @ 730am. Montez Morita, Dinah Beers

## 2011-05-15 NOTE — Progress Notes (Signed)
Chart seen clearance reviewed.  Pt appears adequate for planned surgery.

## 2011-05-18 ENCOUNTER — Encounter (HOSPITAL_COMMUNITY): Payer: Self-pay | Admitting: Anesthesiology

## 2011-05-18 ENCOUNTER — Ambulatory Visit (HOSPITAL_COMMUNITY): Payer: Medicare Other | Admitting: Anesthesiology

## 2011-05-18 ENCOUNTER — Encounter (HOSPITAL_COMMUNITY): Payer: Self-pay | Admitting: *Deleted

## 2011-05-18 ENCOUNTER — Ambulatory Visit (HOSPITAL_COMMUNITY)
Admission: RE | Admit: 2011-05-18 | Discharge: 2011-05-18 | Disposition: A | Discharge status: Home / Self Care | Payer: Medicare Other | Source: Ambulatory Visit | Attending: Oral Surgery | Admitting: Oral Surgery

## 2011-05-18 ENCOUNTER — Encounter (HOSPITAL_COMMUNITY)
Admission: RE | Disposition: A | Discharge status: Home / Self Care | Payer: Self-pay | Source: Ambulatory Visit | Attending: Oral Surgery

## 2011-05-18 DIAGNOSIS — J449 Chronic obstructive pulmonary disease, unspecified: Secondary | ICD-10-CM | POA: Insufficient documentation

## 2011-05-18 DIAGNOSIS — F17200 Nicotine dependence, unspecified, uncomplicated: Secondary | ICD-10-CM | POA: Insufficient documentation

## 2011-05-18 DIAGNOSIS — Z01812 Encounter for preprocedural laboratory examination: Secondary | ICD-10-CM | POA: Insufficient documentation

## 2011-05-18 DIAGNOSIS — L729 Follicular cyst of the skin and subcutaneous tissue, unspecified: Secondary | ICD-10-CM

## 2011-05-18 DIAGNOSIS — I2510 Atherosclerotic heart disease of native coronary artery without angina pectoris: Secondary | ICD-10-CM | POA: Insufficient documentation

## 2011-05-18 DIAGNOSIS — I10 Essential (primary) hypertension: Secondary | ICD-10-CM | POA: Insufficient documentation

## 2011-05-18 DIAGNOSIS — M278 Other specified diseases of jaws: Secondary | ICD-10-CM | POA: Insufficient documentation

## 2011-05-18 DIAGNOSIS — Z01818 Encounter for other preprocedural examination: Secondary | ICD-10-CM | POA: Insufficient documentation

## 2011-05-18 DIAGNOSIS — K029 Dental caries, unspecified: Secondary | ICD-10-CM | POA: Insufficient documentation

## 2011-05-18 DIAGNOSIS — L723 Sebaceous cyst: Secondary | ICD-10-CM | POA: Insufficient documentation

## 2011-05-18 DIAGNOSIS — J4489 Other specified chronic obstructive pulmonary disease: Secondary | ICD-10-CM | POA: Insufficient documentation

## 2011-05-18 DIAGNOSIS — Z720 Tobacco use: Secondary | ICD-10-CM

## 2011-05-18 DIAGNOSIS — K1370 Unspecified lesions of oral mucosa: Secondary | ICD-10-CM | POA: Insufficient documentation

## 2011-05-18 DIAGNOSIS — M270 Developmental disorders of jaws: Secondary | ICD-10-CM

## 2011-05-18 DIAGNOSIS — Z8673 Personal history of transient ischemic attack (TIA), and cerebral infarction without residual deficits: Secondary | ICD-10-CM

## 2011-05-18 DIAGNOSIS — E785 Hyperlipidemia, unspecified: Secondary | ICD-10-CM | POA: Insufficient documentation

## 2011-05-18 DIAGNOSIS — E782 Mixed hyperlipidemia: Secondary | ICD-10-CM

## 2011-05-18 DIAGNOSIS — K219 Gastro-esophageal reflux disease without esophagitis: Secondary | ICD-10-CM | POA: Insufficient documentation

## 2011-05-18 HISTORY — PX: LESION REMOVAL: SHX5196

## 2011-05-18 HISTORY — PX: TOOTH EXTRACTION: SHX859

## 2011-05-18 SURGERY — WIDE EXCISION, LESION, UPPER EXTREMITY
Anesthesia: General | Site: Mouth | Laterality: Right | Wound class: Clean

## 2011-05-18 MED ORDER — CEFAZOLIN SODIUM 1-5 GM-% IV SOLN
INTRAVENOUS | Status: AC
  Filled 2011-05-18: qty 50

## 2011-05-18 MED ORDER — GLYCOPYRROLATE 0.2 MG/ML IJ SOLN
INTRAMUSCULAR | Status: DC | PRN
  Administered 2011-05-18: .8 mg via INTRAVENOUS

## 2011-05-18 MED ORDER — CLINDAMYCIN PHOSPHATE 600 MG/4ML IJ SOLN
INTRAMUSCULAR | Status: AC
  Filled 2011-05-18: qty 4

## 2011-05-18 MED ORDER — SODIUM CHLORIDE 0.9 % IR SOLN
Status: DC | PRN
  Administered 2011-05-18: 1000 mL

## 2011-05-18 MED ORDER — NEOSTIGMINE METHYLSULFATE 1 MG/ML IJ SOLN
INTRAMUSCULAR | Status: DC | PRN
  Administered 2011-05-18: 4 mg via INTRAVENOUS

## 2011-05-18 MED ORDER — DEXTROSE 5 % IV SOLN
INTRAVENOUS | Status: AC
  Filled 2011-05-18: qty 50

## 2011-05-18 MED ORDER — MIDAZOLAM HCL 5 MG/5ML IJ SOLN
INTRAMUSCULAR | Status: DC | PRN
  Administered 2011-05-18: 2 mg via INTRAVENOUS

## 2011-05-18 MED ORDER — ONDANSETRON HCL 4 MG/2ML IJ SOLN
INTRAMUSCULAR | Status: DC | PRN
  Administered 2011-05-18: 4 mg via INTRAVENOUS

## 2011-05-18 MED ORDER — CLINDAMYCIN PHOSPHATE 600 MG/50ML IV SOLN
INTRAVENOUS | Status: DC | PRN
  Administered 2011-05-18: 600 mg via INTRAVENOUS

## 2011-05-18 MED ORDER — VECURONIUM BROMIDE 10 MG IV SOLR
INTRAVENOUS | Status: DC | PRN
  Administered 2011-05-18: 5 mg via INTRAVENOUS

## 2011-05-18 MED ORDER — PROPOFOL 10 MG/ML IV EMUL
INTRAVENOUS | Status: DC | PRN
  Administered 2011-05-18: 100 mg via INTRAVENOUS
  Administered 2011-05-18: 200 mg via INTRAVENOUS

## 2011-05-18 MED ORDER — SODIUM CHLORIDE 0.9 % IR SOLN
Status: DC | PRN
  Administered 2011-05-18: 1

## 2011-05-18 MED ORDER — PROMETHAZINE HCL 25 MG/ML IJ SOLN: 6.2500 mg | INTRAMUSCULAR | Status: DC | PRN

## 2011-05-18 MED ORDER — OXYMETAZOLINE HCL 0.05 % NA SOLN
NASAL | Status: DC | PRN
  Administered 2011-05-18: 1 via NASAL

## 2011-05-18 MED ORDER — LACTATED RINGERS IV SOLN: INTRAVENOUS | Status: DC

## 2011-05-18 MED ORDER — LACTATED RINGERS IV SOLN
INTRAVENOUS | Status: DC | PRN
  Administered 2011-05-18 (×2): via INTRAVENOUS

## 2011-05-18 MED ORDER — LIDOCAINE-EPINEPHRINE 2 %-1:100000 IJ SOLN
INTRAMUSCULAR | Status: DC | PRN
  Administered 2011-05-18: 20 mL

## 2011-05-18 MED ORDER — LACTATED RINGERS IV SOLN
INTRAVENOUS | Status: DC
Start: 2011-05-18 — End: 2011-05-18
  Administered 2011-05-18: 10:00:00 via INTRAVENOUS

## 2011-05-18 MED ORDER — FENTANYL CITRATE 0.05 MG/ML IJ SOLN
INTRAMUSCULAR | Status: DC | PRN
  Administered 2011-05-18 (×2): 100 ug via INTRAVENOUS

## 2011-05-18 MED ORDER — OXYCODONE-ACETAMINOPHEN 10-325 MG PO TABS: 1.0000 | ORAL_TABLET | ORAL | Status: AC | PRN

## 2011-05-18 MED ORDER — EPHEDRINE SULFATE 50 MG/ML IJ SOLN
INTRAMUSCULAR | Status: DC | PRN
  Administered 2011-05-18: 5 mg via INTRAVENOUS
  Administered 2011-05-18: 10 mg via INTRAVENOUS

## 2011-05-18 MED ORDER — MEPERIDINE HCL 25 MG/ML IJ SOLN
6.2500 mg | INTRAMUSCULAR | Status: DC | PRN
Start: 2011-05-18 — End: 2011-05-18

## 2011-05-18 MED ORDER — LIDOCAINE HCL (CARDIAC) 20 MG/ML IV SOLN
INTRAVENOUS | Status: DC | PRN
  Administered 2011-05-18: 100 mg via INTRAVENOUS

## 2011-05-18 MED ORDER — HYDROMORPHONE HCL PF 1 MG/ML IJ SOLN
0.2500 mg | INTRAMUSCULAR | Status: DC | PRN
  Administered 2011-05-18 (×2): 0.5 mg via INTRAVENOUS

## 2011-05-18 SURGICAL SUPPLY — 36 items
BLADE SURG 15 STRL LF DISP TIS (BLADE) ×2 IMPLANT
BLADE SURG 15 STRL SS (BLADE) ×8
BUR CROSS CUT FISSURE 1.6 (BURR) ×4 IMPLANT
BUR EGG ELITE 4.0 (BURR) ×4 IMPLANT
CANISTER SUCTION 2500CC (MISCELLANEOUS) ×4 IMPLANT
CLOTH BEACON ORANGE TIMEOUT ST (SAFETY) ×4 IMPLANT
COVER SURGICAL LIGHT HANDLE (MISCELLANEOUS) ×4 IMPLANT
CRADLE DONUT ADULT HEAD (MISCELLANEOUS) ×4 IMPLANT
DECANTER SPIKE VIAL GLASS SM (MISCELLANEOUS) ×4 IMPLANT
GAUZE PACKING FOLDED 2  STR (GAUZE/BANDAGES/DRESSINGS) ×1
GAUZE PACKING FOLDED 2 STR (GAUZE/BANDAGES/DRESSINGS) ×3 IMPLANT
GLOVE BIO SURGEON STRL SZ 6 (GLOVE) ×2 IMPLANT
GLOVE BIO SURGEON STRL SZ 6.5 (GLOVE) ×4 IMPLANT
GLOVE BIO SURGEON STRL SZ7 (GLOVE) ×2 IMPLANT
GLOVE BIO SURGEON STRL SZ7.5 (GLOVE) ×4 IMPLANT
GLOVE BIOGEL PI IND STRL 7.0 (GLOVE) ×4 IMPLANT
GLOVE BIOGEL PI INDICATOR 7.0 (GLOVE) ×2
GOWN STRL NON-REIN LRG LVL3 (GOWN DISPOSABLE) ×8 IMPLANT
GOWN STRL REIN XL XLG (GOWN DISPOSABLE) ×4 IMPLANT
KIT BASIN OR (CUSTOM PROCEDURE TRAY) ×4 IMPLANT
KIT ROOM TURNOVER OR (KITS) ×4 IMPLANT
NEEDLE 22X1 1/2 (OR ONLY) (NEEDLE) ×4 IMPLANT
NS IRRIG 1000ML POUR BTL (IV SOLUTION) ×4 IMPLANT
PAD ARMBOARD 7.5X6 YLW CONV (MISCELLANEOUS) ×8 IMPLANT
SOLUTION BETADINE 4OZ (MISCELLANEOUS) ×4 IMPLANT
SPECIMEN JAR SMALL (MISCELLANEOUS) ×2 IMPLANT
SUT CHROMIC 3 0 PS 2 (SUTURE) ×6 IMPLANT
SUT CHROMIC 4 0 SH 27 (SUTURE) ×2 IMPLANT
SUT PROLENE 5 0 C1 (SUTURE) ×2 IMPLANT
SYR 50ML SLIP (SYRINGE) IMPLANT
SYR CONTROL 10ML LL (SYRINGE) ×2 IMPLANT
TOWEL OR 17X26 10 PK STRL BLUE (TOWEL DISPOSABLE) ×4 IMPLANT
TRAY ENT MC OR (CUSTOM PROCEDURE TRAY) ×4 IMPLANT
TUBING IRRIGATION (MISCELLANEOUS) ×2 IMPLANT
WATER STERILE IRR 1000ML POUR (IV SOLUTION) IMPLANT
YANKAUER SUCT BULB TIP NO VENT (SUCTIONS) ×4 IMPLANT

## 2011-05-18 NOTE — Anesthesia Procedure Notes (Signed)
Procedure Name: Intubation Date/Time: 05/18/2011 10:09 AM Performed by: Carmela Rima Pre-anesthesia Checklist: Patient identified, Timeout performed, Emergency Drugs available, Suction available and Patient being monitored Patient Re-evaluated:Patient Re-evaluated prior to inductionOxygen Delivery Method: Circle system utilized Preoxygenation: Pre-oxygenation with 100% oxygen Intubation Type: IV induction Ventilation: Mask ventilation without difficulty Laryngoscope Size: Mac and 4 Grade View: Grade I Nasal Tubes: Nasal prep performed, Left, Nasal Rae and Magill forceps- large, utilized Tube size: 7.5 mm Number of attempts: 1 Placement Confirmation: ETT inserted through vocal cords under direct vision,  breath sounds checked- equal and bilateral and positive ETCO2 Tube secured with: Tape Dental Injury: Bloody posterior oropharynx and Teeth and Oropharynx as per pre-operative assessment

## 2011-05-18 NOTE — Op Note (Signed)
NAMEHILBERT, Mark Waller NO.:  [id number]  MEDICAL RECORD NO.:  [id number]  LOCATION:  [facility]                         FACILITY:  [facility]  PHYSICIAN:  Georgia Lopes, M.D.  DATE OF BIRTH:  September 19, 1960  DATE OF PROCEDURE:  05/18/2011 DATE OF DISCHARGE:                              OPERATIVE REPORT   PREOPERATIVE DIAGNOSES:  Nonrestorable teeth numbers 2, 4, 5, 6, 12, 13, 14, 15, 18, 20, 21, 28, 29, 30, 31.  Bilateral mandibular lingual tori. Facial lesion, right cheek.  Lesion of palate.  POSTOPERATIVE DIAGNOSES:  Nonrestorable teeth numbers 2, 4, 5, 6, 12, 13, 14, 15, 18, 20, 21, 28, 29, 30, 31.  Bilateral mandibular lingual tori.  Facial lesion, right cheek. Lesion of palate. Oral antral communication, right maxilla.  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  SURGEON:  Georgia Lopes, M.D.  ANESTHESIA:  General.  INDICATIONS FOR PROCEDURE:  Orell is a 51 year old gentleman who is referred by his general dentist for removal of all remaining teeth secondary to dental caries.  In addition, he had bilateral lingual tori, which would preclude fabrication of lower denture.  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 As the patient has a long history of smoking, biopsy was recommended. There was a 2 cm x 2.5 cm facial lesion of the right cheek, which appeared to be a dermal or epidermal cyst.  Because of the number of teeth to be removed and the difficulty of surgery anticipated, it was recommended the patient to undergo the procedure with general anesthesia.  DESCRIPTION OF PROCEDURE:  The patient was taken to the operating room and placed on the table in supine  position.  General anesthesia was administered intravenously and a nasal endotracheal tube was placed. The eyes were protected.  The patient was draped for the procedure. Time-out was performed.  Then 2% lidocaine 1:100000 epinephrine was infiltrated around the facial cheek lesion.  Then a curvilinear incision was made around the lateral portion of the lesion through the skin and subcutaneous tissue.  Then hemostats and tenotomy scissors were used to bluntly dissect until the lesion was identified and then the lesion was removed and submitted for pathological examination.  One deep 4-0 chromic suture was placed and then 5-0 Prolene was placed to close the skin.  Attention was then turned to the oral cavity.  The posterior pharynx was suctioned with Yankauer suction and a throat-pack was placed.  Lidocaine 2% in 1:100000 epinephrine was infiltrated in inferior-alveolar block on the right and left side, buccal and palatal infiltration in the maxilla, total of 16 mL was utilized; 4 mL had been utilized in the localization of the facial lesion.  Anesthesia was also placed in the midline of the palate.  A 15 blade was then used to make a elliptical incisional biopsy of the palatal lesion.  This was placed in formalin.  The area was sutured with 4-0 chromic.  Then, a bite-block was placed on the right side of the mouth and the left side was operated first.  A 15 blade was used to make a full-thickness incision in the left mandible beginning at tooth number 18, carried anteriorly around teeth numbers 20 and 21, into the left canine region, in the mandible and then in the maxilla.  The incision was created around teeth numbers 12 through 15 with a 15 blade.  Then the periosteum was reflected from around these teeth in the maxilla and mandible, and interproximal bone was removed from around these teeth using the Stryker handpiece with a fissure bur.  Teeth were then elevated and removed from the  mouth with a dental forceps.  The teeth fractured and additional bone was removed from around these teeth and then the teeth were extracted with a 301 elevator and the hemostats, and then the periosteum was further reflected and allow access to the lingual torus on the left side.  The egg-shaped bur was then used to smooth the torus taking care to retract the lingual tissues with a Electronics engineer.  Then the maxilla-mandible alveoplasty was performed using the egg-shaped bur and bone file.  Then, the area was left open and the bite-block was repositioned to the other side of the mouth so that a denture could be tried and later prior to closure.  Then the 15 blade was used to make a full-thickness incision around the mandibular teeth numbers 28, 29, 30, 31, carried anteriorly to the canine area and then in the maxilla.  The incision was created around teeth numbers 1, 4, 5, and 6 with a 15 blade.  Then the periosteal elevator was used to reflect the periosteum from around these teeth in the maxilla and mandible, and then interproximal bone was removed using Stryker handpiece with a fissure bur under irrigation. The teeth were elevated with a 301 elevator and removed from the mouth with the universal forceps.  In the maxilla, teeth numbers 2, 4, and 5 required additional bone removal and sectioning.  Upon removal of tooth numbers 4 and 5, there was bone removed that was the sinus floor creating an oroantral communication, approximately 1 cm in the mandible. Teeth numbers 28, 29, 30, and 31, all required additional bone removal and removal of tooth fragments.  Then the palatal-antral communication was addressed.  A fresh 15 blade was used to make releasing incision in the distal aspect of the maxilla buccally and then periosteum releasing incision was created inside the flap so that primary closure could be achieved with the buccal flap.  Then bluntly hemostat was used to gently tease the  buccal fat pad until a approximately 4 cm pedicle was created and would reach across the 1 cm oroantral communication.  Then attention was turned to the mandible on the right side, an osteotome and mallet were used to cleave the right mandibular lingual torus.  Other fragments of bone were obtained from this area and then these were placed up in the oroantral communication and wedged into place to occlude the sinus. Then the buccal flap fat pad was sutured over the oroantral communication using 4-0 chromic and then the buccal flap of gingival tissue was then sutured into place using 3-0 chromic to allow for primary closure.  Then the torus was further reduced and smoothed using the egg-shaped bur and a bone file, and then alveoplasty was performed in the  right mandible.  Alveoplasty had previously been performed in the right maxilla prior to placing the bone graft in the oroantral communication area.  Then, the dentures were placed in the mouth noted to have good fit without any obvious interferences.  Then, the all incisions were closed with 3-0 chromic in the mandible and maxilla.  The oral cavity was then inspected and found to have good closure and hemostasis.  The oral cavity was irrigated with normal saline and suctioned.  Throat pack was removed.  The dentures were placed in the mouth.  The patient was awakened and taken to the recovery room breathing spontaneously in good condition.  ESTIMATED BLOOD LOSS:  Minimal.  COMPLICATIONS:  None.  SPECIMEN:  Dermal cyst right cheek and palatal lesion.     Georgia Lopes, M.D.     SMJ/MEDQ  D:  05/18/2011  T:  05/18/2011  Job:  [id number]

## 2011-05-18 NOTE — Transfer of Care (Signed)
Immediate Anesthesia Transfer of Care Note  Patient: Mark Waller  Procedure(s) Performed: Procedure(s) (LRB): LESION REMOVAL (Right) DENTAL RESTORATION/EXTRACTIONS (Bilateral)  Patient Location: PACU  Anesthesia Type: General  Level of Consciousness: awake, alert  and oriented  Airway & Oxygen Therapy: Patient Spontanous Breathing and Patient connected to face mask oxygen  Post-op Assessment: Report given to PACU RN, Post -op Vital signs reviewed and stable and Patient moving all extremities X 4  Post vital signs: Reviewed and stable  Complications: No apparent anesthesia complications

## 2011-05-18 NOTE — Anesthesia Preprocedure Evaluation (Addendum)
Anesthesia Evaluation  Patient identified by MRN, date of birth, ID band Patient awake    Reviewed: Allergy & Precautions, H&P , NPO status , Patient's Chart, lab work & pertinent test results  Airway Mallampati: II TM Distance: >3 FB Neck ROM: Full    Dental No notable dental hx. (+) Poor Dentition, Missing and Dental Advisory Given   Pulmonary COPD COPD inhaler, Current Smoker,  breath sounds clear to auscultation  + decreased breath sounds      Cardiovascular hypertension, Pt. on medications + CAD (mild nonobstructive dz 2005 cath) and + Past MI Rhythm:Regular Rate:Normal     Neuro/Psych PSYCHIATRIC DISORDERS Anxiety CVA, No Residual Symptoms    GI/Hepatic Neg liver ROS, GERD-  Medicated,  Endo/Other  negative endocrine ROS  Renal/GU negative Renal ROS  negative genitourinary   Musculoskeletal negative musculoskeletal ROS (+)   Abdominal   Peds negative pediatric ROS (+)  Hematology negative hematology ROS (+)   Anesthesia Other Findings   Reproductive/Obstetrics negative OB ROS                         Anesthesia Physical Anesthesia Plan  ASA: III  Anesthesia Plan: General   Post-op Pain Management:    Induction: Intravenous  Airway Management Planned: Nasal ETT  Additional Equipment:   Intra-op Plan:   Post-operative Plan: Extubation in OR  Informed Consent: I have reviewed the patients History and Physical, chart, labs and discussed the procedure including the risks, benefits and alternatives for the proposed anesthesia with the patient or authorized representative who has indicated his/her understanding and acceptance.   Dental advisory given and Dental Advisory Given  Plan Discussed with: CRNA  Anesthesia Plan Comments:        Anesthesia Quick Evaluation

## 2011-05-18 NOTE — Op Note (Signed)
05/18/2011  11:20 AM  PATIENT:  Mark Waller  51 y.o. male  PRE-OPERATIVE DIAGNOSIS:  FACIAL LESION RIGHT CHEEK, NONRESTORABLE TEETH #'s 2, 4, 5, 6, 12, 13, 14, 15, 18, 20, 21, 28, 29, 30, 31, bilateral Mandibular lingual tori, lesion palate POST-OPERATIVE DIAGNOSIS:  SAME, Oral antral communication Right maxillary sinus  PROCEDURE:  Procedure(s): Extraction TEETH #'s 2, 4, 5, 6, 12, 13, 14, 15, 18, 20, 21, 28, 29, 30, 31, Alveoloplasty right and left maxilla and mandible, Removal bilateral mandibular tori, closure right maxillary oral antral fistula, removal cyst right face-cheek area. Biopsy lesion palate   SURGEON:  Surgeon(s): Georgia Lopes, DDS  ANESTHESIA:   local and general  EBL:  minimal  DRAINS: none   SPECIMEN:  No Specimen  COUNTS:  YES  PLAN OF CARE: Discharge to home after PACU  PATIENT DISPOSITION:  PACU - hemodynamically stable.   PROCEDURE DETAILS: Dictation #[id number]  Georgia Lopes, DMD 05/18/2011 11:20 AM

## 2011-05-18 NOTE — Anesthesia Postprocedure Evaluation (Signed)
  Anesthesia Post-op Note  Patient: Mark Waller  Procedure(s) Performed: Procedure(s) (LRB): LESION REMOVAL (Right) DENTAL RESTORATION/EXTRACTIONS (Bilateral)  Patient Location: PACU  Anesthesia Type: General  Level of Consciousness: awake and alert   Airway and Oxygen Therapy: Patient Spontanous Breathing  Post-op Pain: mild  Post-op Assessment: Post-op Vital signs reviewed, Patient's Cardiovascular Status Stable, Respiratory Function Stable, Patent Airway and No signs of Nausea or vomiting  Post-op Vital Signs: stable  Complications: No apparent anesthesia complications

## 2011-05-18 NOTE — Preoperative (Signed)
Beta Blockers   Reason not to administer Beta Blockers:Not Applicable 
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
## 2011-05-19 ENCOUNTER — Encounter (HOSPITAL_COMMUNITY): Payer: Self-pay | Admitting: Oral Surgery

## 2011-09-28 DIAGNOSIS — R209 Unspecified disturbances of skin sensation: Secondary | ICD-10-CM

## 2012-09-20 IMAGING — US US CAROTID DUPLEX BILAT
1 series · 13 of 24 positions shown · non-contrast
Comparison: None

CLINICAL DATA: Right side weakness, recent stroke, hypertension,
history of smoking

BILATERAL CAROTID DUPLEX ULTRASOUND
TECHNIQUE: Gray scale imaging, color Doppler and duplex ultrasound
was performed of bilateral carotid and vertebral arteries in the
neck.

[Series 1: us carotid duplex bilat · 0.07mm/px · 13 of 62 slices shown]
[im 1/62]
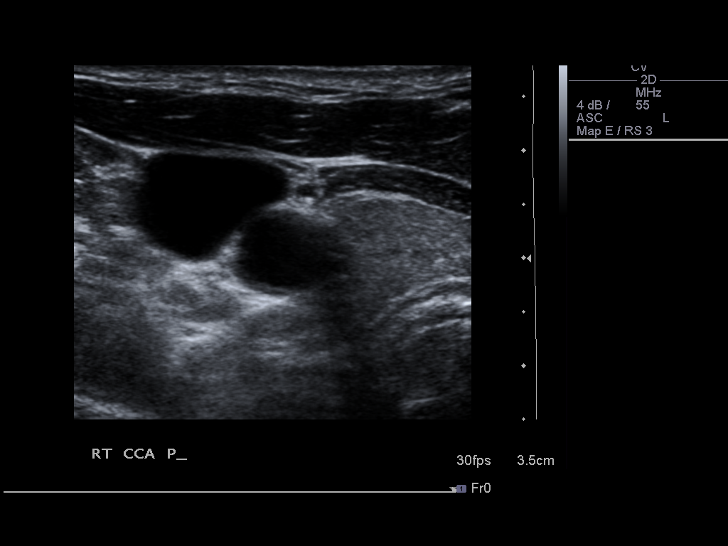
[im 6/62]
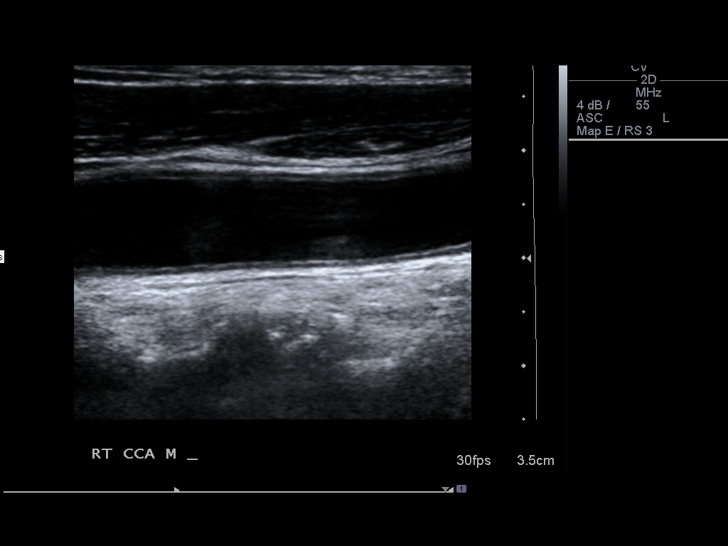
[im 11/62]
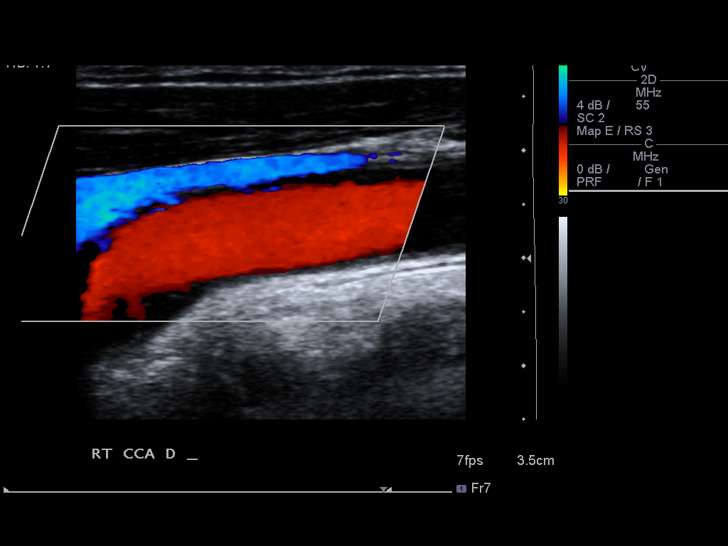
[im 16/62]
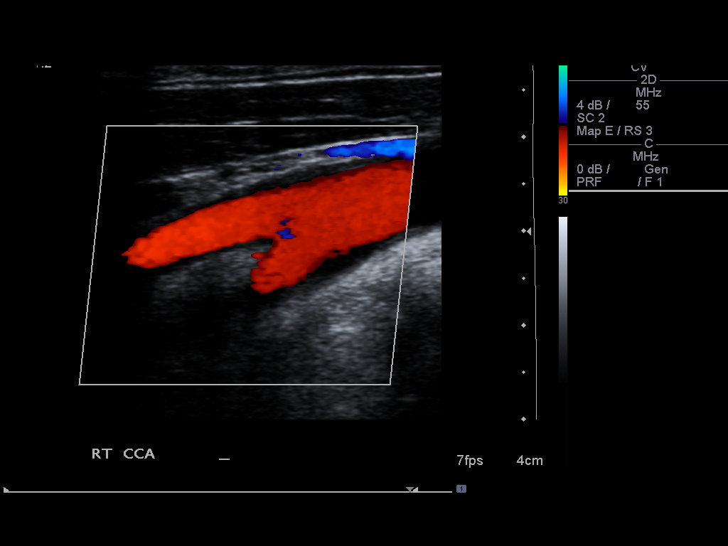
[im 22/62]
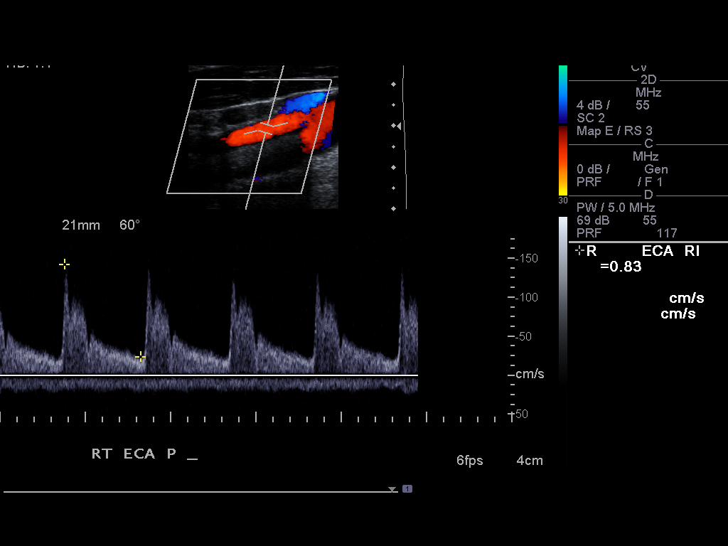
[im 27/62]
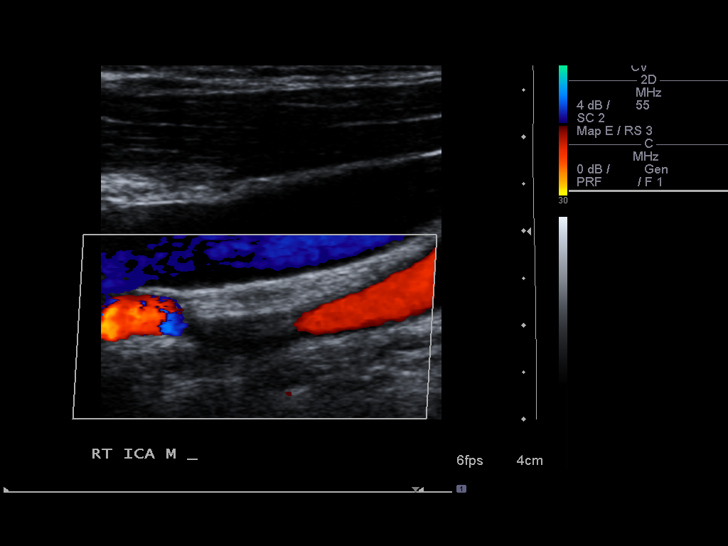
[im 32/62]
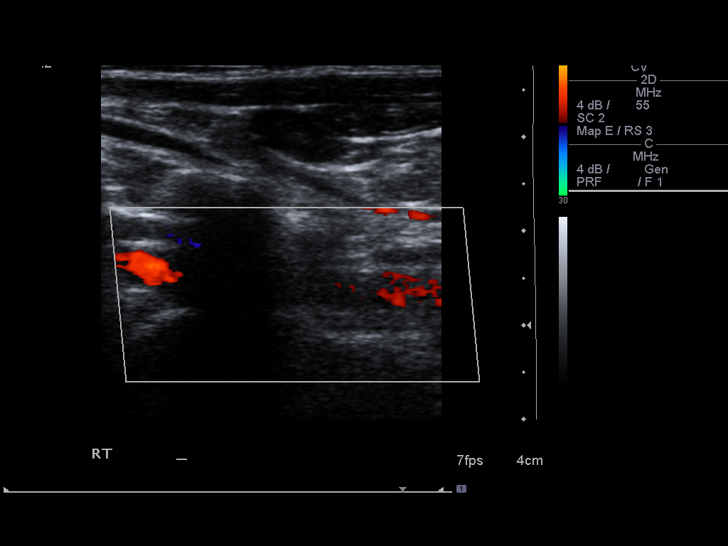
[im 35/62]
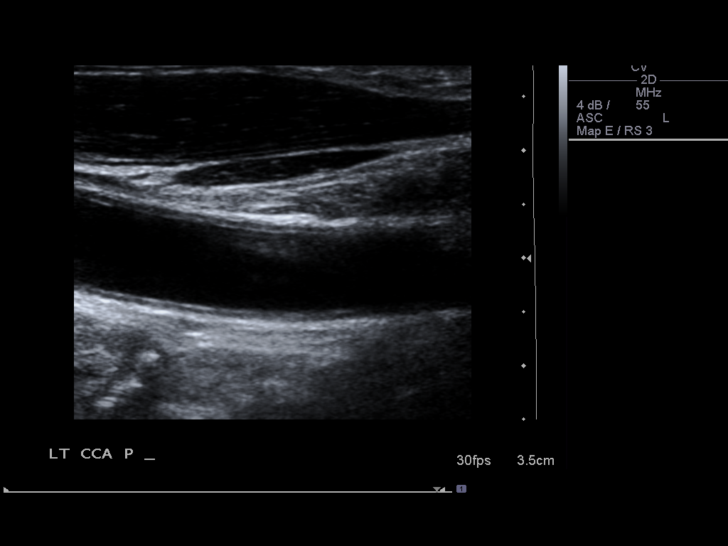
[im 40/62]
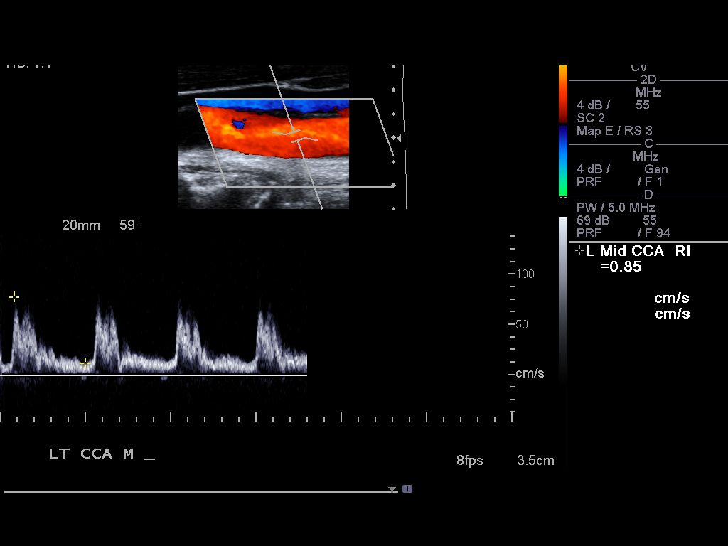
[im 46/62]
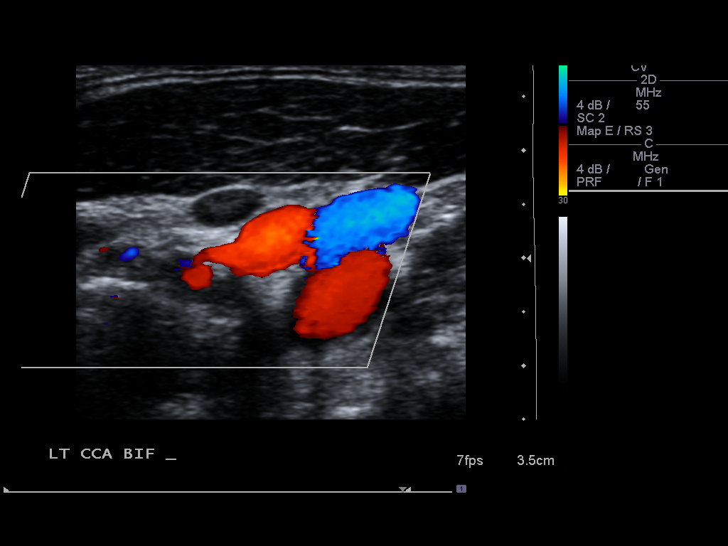
[im 51/62]
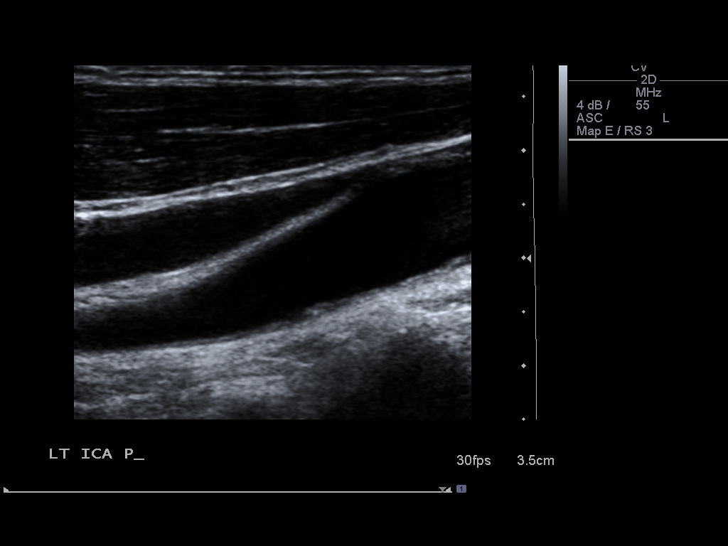
[im 56/62]
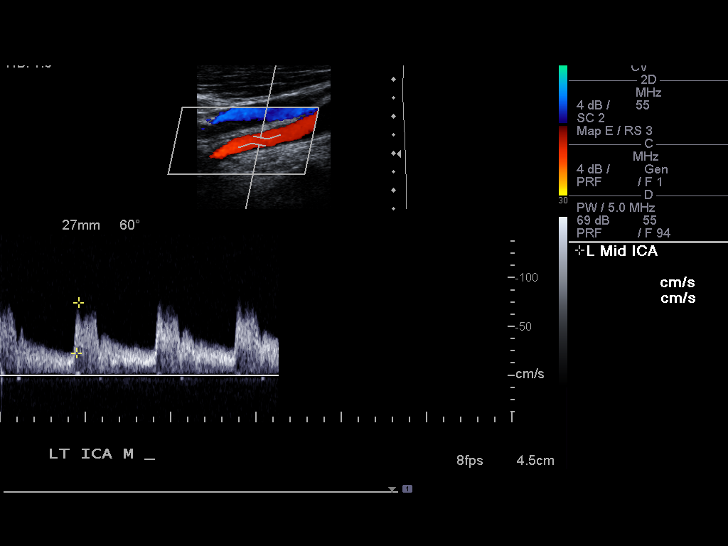
[im 62/62]
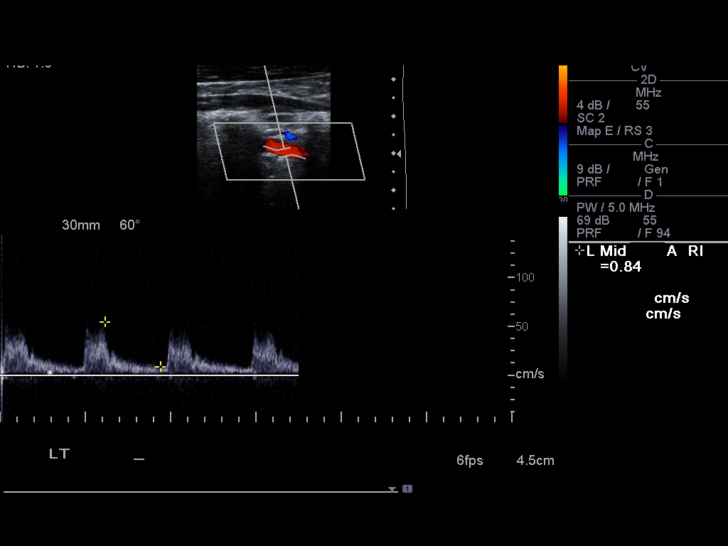

[13 of 24 positions shown; findings below may reference images not displayed]

Criteria:  Quantification of carotid stenosis is based on velocity
parameters that correlate the residual internal carotid diameter
with NASCET-based stenosis levels, using the diameter of the distal
internal carotid lumen as the denominator for stenosis measurement.

The following velocity measurements were obtained:

                 PEAK SYSTOLIC/END DIASTOLIC
RIGHT
ICA:                        75/23cm/sec
CCA:                        87/2cm/sec
SYSTOLIC ICA/CCA RATIO:
DIASTOLIC ICA/CCA RATIO:
ECA:                        142cm/sec

LEFT
ICA:                        79/14cm/sec
CCA:                        78/12cm/sec
SYSTOLIC ICA/CCA RATIO:
DIASTOLIC ICA/CCA RATIO:
ECA:                        135cm/sec
FINDINGS: RIGHT CAROTID ARTERY: Minimal intimal thickening right CCA.
Minimal noncalcified plaque left carotid bulb.  No significant,
calcified, or shadowing plaque.  Laminar flow by color Doppler
imaging. Mild tortuosity of the right ICA with mild spectral
broadening on waveform analysis.  No high velocity jets.

RIGHT VERTEBRAL ARTERY:  Patent, antegrade

LEFT CAROTID ARTERY: Mild intimal thickening left CCA.  No
significant plaque formation identified.  Minimally turbulent flow
left carotid bulb.  Laminar flow by color Doppler imaging.  Mild
spectral broadening on waveform analysis, question related to
minimal tortuosity.  No high velocity jets.

LEFT VERTEBRAL ARTERY:  Patent, antegrade
IMPRESSION: No evidence of significant plaque formation or stenosis in either
carotid system.

## 2013-12-18 DIAGNOSIS — J449 Chronic obstructive pulmonary disease, unspecified: Secondary | ICD-10-CM | POA: Insufficient documentation

## 2014-12-20 DIAGNOSIS — I739 Peripheral vascular disease, unspecified: Secondary | ICD-10-CM | POA: Insufficient documentation

## 2015-07-25 ENCOUNTER — Institutional Professional Consult (permissible substitution): Payer: Medicare Other | Admitting: Pulmonary Disease

## 2015-09-03 ENCOUNTER — Institutional Professional Consult (permissible substitution): Payer: Medicare Other | Admitting: Pulmonary Disease

## 2016-12-01 DIAGNOSIS — M19012 Primary osteoarthritis, left shoulder: Secondary | ICD-10-CM | POA: Insufficient documentation

## 2017-01-18 DIAGNOSIS — Z9889 Other specified postprocedural states: Secondary | ICD-10-CM | POA: Insufficient documentation

## 2017-04-17 DIAGNOSIS — K219 Gastro-esophageal reflux disease without esophagitis: Secondary | ICD-10-CM | POA: Insufficient documentation

## 2017-07-09 ENCOUNTER — Ambulatory Visit: Payer: Medicare HMO | Admitting: Urology

## 2018-02-01 ENCOUNTER — Encounter (INDEPENDENT_AMBULATORY_CARE_PROVIDER_SITE_OTHER): Payer: Medicare HMO | Admitting: Internal Medicine

## 2018-02-01 ENCOUNTER — Encounter (INDEPENDENT_AMBULATORY_CARE_PROVIDER_SITE_OTHER): Payer: Self-pay | Admitting: Internal Medicine

## 2018-08-19 ENCOUNTER — Other Ambulatory Visit: Payer: Self-pay | Admitting: Urology

## 2018-08-23 ENCOUNTER — Other Ambulatory Visit: Payer: Self-pay | Admitting: Urology

## 2018-08-25 ENCOUNTER — Other Ambulatory Visit: Payer: Self-pay

## 2018-08-25 ENCOUNTER — Other Ambulatory Visit (HOSPITAL_COMMUNITY)
Admission: RE | Admit: 2018-08-25 | Discharge: 2018-08-25 | Disposition: A | Discharge status: Home / Self Care | Payer: Medicare HMO | Source: Ambulatory Visit | Attending: Urology | Admitting: Urology

## 2018-08-25 DIAGNOSIS — Z01812 Encounter for preprocedural laboratory examination: Secondary | ICD-10-CM | POA: Insufficient documentation

## 2018-08-25 DIAGNOSIS — Z20828 Contact with and (suspected) exposure to other viral communicable diseases: Secondary | ICD-10-CM | POA: Insufficient documentation

## 2018-08-25 LAB — SARS CORONAVIRUS 2 (TAT 6-24 HRS): SARS Coronavirus 2: NEGATIVE

## 2018-08-26 ENCOUNTER — Other Ambulatory Visit: Payer: Self-pay

## 2018-08-26 ENCOUNTER — Encounter (HOSPITAL_BASED_OUTPATIENT_CLINIC_OR_DEPARTMENT_OTHER): Payer: Self-pay

## 2018-08-26 NOTE — Progress Notes (Signed)
Spoke w/ pt via phone for pre-op interview.  Npo after mn w/ exception clear liquids until 0700 then nothing by mouth, pt verbalized understanding.  Arrive at 1100.  Needs istat and ekg.  Pt had covid test done yesterday.  Will take prilosec, norvasc, flomax and do spiriva am dos w/ sips of water.

## 2018-08-26 NOTE — Progress Notes (Signed)
   08/26/18 1827  OBSTRUCTIVE SLEEP APNEA  Have you ever been diagnosed with sleep apnea through a sleep study? No  Do you snore loudly (loud enough to be heard through closed doors)?  1  Do you often feel tired, fatigued, or sleepy during the daytime (such as falling asleep during driving or talking to someone)? 0  Has anyone observed you stop breathing during your sleep? 1  Do you have, or are you being treated for high blood pressure? 1  BMI more than 35 kg/m2? 0  Age > 72 (1-yes) 1  Male Gender (Yes=1) 1  Obstructive Sleep Apnea Score 5  Score 5 or greater  Results sent to PCP

## 2018-08-29 ENCOUNTER — Ambulatory Visit (HOSPITAL_BASED_OUTPATIENT_CLINIC_OR_DEPARTMENT_OTHER)
Admission: RE | Admit: 2018-08-29 | Discharge: 2018-08-29 | Disposition: A | Discharge status: Home / Self Care | Payer: Medicare HMO | Attending: Urology | Admitting: Urology

## 2018-08-29 ENCOUNTER — Encounter (HOSPITAL_BASED_OUTPATIENT_CLINIC_OR_DEPARTMENT_OTHER)
Admission: RE | Disposition: A | Discharge status: Home / Self Care | Payer: Self-pay | Source: Home / Self Care | Attending: Urology

## 2018-08-29 ENCOUNTER — Ambulatory Visit (HOSPITAL_BASED_OUTPATIENT_CLINIC_OR_DEPARTMENT_OTHER): Payer: Medicare HMO | Admitting: Anesthesiology

## 2018-08-29 ENCOUNTER — Encounter (HOSPITAL_BASED_OUTPATIENT_CLINIC_OR_DEPARTMENT_OTHER): Payer: Self-pay

## 2018-08-29 DIAGNOSIS — I2510 Atherosclerotic heart disease of native coronary artery without angina pectoris: Secondary | ICD-10-CM | POA: Diagnosis not present

## 2018-08-29 DIAGNOSIS — E785 Hyperlipidemia, unspecified: Secondary | ICD-10-CM | POA: Insufficient documentation

## 2018-08-29 DIAGNOSIS — I10 Essential (primary) hypertension: Secondary | ICD-10-CM | POA: Diagnosis not present

## 2018-08-29 DIAGNOSIS — R0600 Dyspnea, unspecified: Secondary | ICD-10-CM | POA: Diagnosis not present

## 2018-08-29 DIAGNOSIS — Z8249 Family history of ischemic heart disease and other diseases of the circulatory system: Secondary | ICD-10-CM | POA: Diagnosis not present

## 2018-08-29 DIAGNOSIS — Z8673 Personal history of transient ischemic attack (TIA), and cerebral infarction without residual deficits: Secondary | ICD-10-CM | POA: Diagnosis not present

## 2018-08-29 DIAGNOSIS — R9341 Abnormal radiologic findings on diagnostic imaging of renal pelvis, ureter, or bladder: Secondary | ICD-10-CM | POA: Insufficient documentation

## 2018-08-29 DIAGNOSIS — Z886 Allergy status to analgesic agent status: Secondary | ICD-10-CM | POA: Insufficient documentation

## 2018-08-29 DIAGNOSIS — K219 Gastro-esophageal reflux disease without esophagitis: Secondary | ICD-10-CM | POA: Insufficient documentation

## 2018-08-29 DIAGNOSIS — Z885 Allergy status to narcotic agent status: Secondary | ICD-10-CM | POA: Insufficient documentation

## 2018-08-29 DIAGNOSIS — I252 Old myocardial infarction: Secondary | ICD-10-CM | POA: Insufficient documentation

## 2018-08-29 DIAGNOSIS — Z880 Allergy status to penicillin: Secondary | ICD-10-CM | POA: Insufficient documentation

## 2018-08-29 DIAGNOSIS — R310 Gross hematuria: Secondary | ICD-10-CM | POA: Insufficient documentation

## 2018-08-29 DIAGNOSIS — C672 Malignant neoplasm of lateral wall of bladder: Secondary | ICD-10-CM | POA: Diagnosis not present

## 2018-08-29 DIAGNOSIS — D494 Neoplasm of unspecified behavior of bladder: Secondary | ICD-10-CM | POA: Diagnosis present

## 2018-08-29 DIAGNOSIS — J449 Chronic obstructive pulmonary disease, unspecified: Secondary | ICD-10-CM | POA: Diagnosis not present

## 2018-08-29 DIAGNOSIS — F419 Anxiety disorder, unspecified: Secondary | ICD-10-CM | POA: Diagnosis not present

## 2018-08-29 DIAGNOSIS — F17210 Nicotine dependence, cigarettes, uncomplicated: Secondary | ICD-10-CM | POA: Diagnosis not present

## 2018-08-29 DIAGNOSIS — R200 Anesthesia of skin: Secondary | ICD-10-CM | POA: Diagnosis not present

## 2018-08-29 HISTORY — DX: Anesthesia of skin: R20.0

## 2018-08-29 HISTORY — DX: Other forms of dyspnea: R06.09

## 2018-08-29 HISTORY — DX: Personal history of transient ischemic attack (TIA), and cerebral infarction without residual deficits: Z86.73

## 2018-08-29 HISTORY — PX: CYSTOSCOPY W/ RETROGRADES: SHX1426

## 2018-08-29 HISTORY — DX: Personal history of other (healed) physical injury and trauma: Z87.828

## 2018-08-29 HISTORY — DX: Presence of spectacles and contact lenses: Z97.3

## 2018-08-29 HISTORY — DX: Benign prostatic hyperplasia with lower urinary tract symptoms: N40.1

## 2018-08-29 HISTORY — PX: TRANSURETHRAL RESECTION OF BLADDER TUMOR: SHX2575

## 2018-08-29 HISTORY — DX: Dyspnea, unspecified: R06.00

## 2018-08-29 LAB — POCT I-STAT, CHEM 8
BUN: 13 mg/dL (ref 6–20)
Calcium, Ion: 1.23 mmol/L (ref 1.15–1.40)
Chloride: 103 mmol/L (ref 98–111)
Creatinine, Ser: 0.8 mg/dL (ref 0.61–1.24)
Glucose, Bld: 91 mg/dL (ref 70–99)
HCT: 50 % (ref 39.0–52.0)
Hemoglobin: 17 g/dL (ref 13.0–17.0)
Potassium: 4.6 mmol/L (ref 3.5–5.1)
Sodium: 139 mmol/L (ref 135–145)
TCO2: 24 mmol/L (ref 22–32)

## 2018-08-29 SURGERY — TURBT (TRANSURETHRAL RESECTION OF BLADDER TUMOR)
Anesthesia: General | Site: Ureter

## 2018-08-29 MED ORDER — ONDANSETRON HCL 4 MG/2ML IJ SOLN
INTRAMUSCULAR | Status: DC | PRN
  Administered 2018-08-29: 4 mg via INTRAVENOUS

## 2018-08-29 MED ORDER — ACETAMINOPHEN 325 MG PO TABS
325.0000 mg | ORAL_TABLET | ORAL | Status: DC | PRN
  Filled 2018-08-29: qty 2

## 2018-08-29 MED ORDER — ACETAMINOPHEN 160 MG/5ML PO SOLN
325.0000 mg | ORAL | Status: DC | PRN
  Filled 2018-08-29: qty 20.3

## 2018-08-29 MED ORDER — LACTATED RINGERS IV SOLN
INTRAVENOUS | Status: DC
  Administered 2018-08-29: 1000 mL via INTRAVENOUS
  Administered 2018-08-29: 12:00:00 via INTRAVENOUS
  Filled 2018-08-29: qty 1000

## 2018-08-29 MED ORDER — OXYCODONE HCL 5 MG PO TABS
ORAL_TABLET | ORAL | Status: AC
  Filled 2018-08-29: qty 1

## 2018-08-29 MED ORDER — OXYCODONE HCL 5 MG/5ML PO SOLN
5.0000 mg | Freq: Once | ORAL | Status: AC | PRN
  Filled 2018-08-29: qty 5

## 2018-08-29 MED ORDER — ONDANSETRON HCL 4 MG/2ML IJ SOLN
4.0000 mg | Freq: Once | INTRAMUSCULAR | Status: DC | PRN
  Filled 2018-08-29: qty 2

## 2018-08-29 MED ORDER — FENTANYL CITRATE (PF) 100 MCG/2ML IJ SOLN
INTRAMUSCULAR | Status: AC
  Filled 2018-08-29: qty 2

## 2018-08-29 MED ORDER — CEFAZOLIN SODIUM-DEXTROSE 2-4 GM/100ML-% IV SOLN
2.0000 g | INTRAVENOUS | Status: AC
  Administered 2018-08-29: 13:00:00 2 g via INTRAVENOUS
  Filled 2018-08-29: qty 100

## 2018-08-29 MED ORDER — DEXAMETHASONE SODIUM PHOSPHATE 10 MG/ML IJ SOLN
INTRAMUSCULAR | Status: DC | PRN
  Administered 2018-08-29: 5 mg via INTRAVENOUS

## 2018-08-29 MED ORDER — SUGAMMADEX SODIUM 200 MG/2ML IV SOLN
INTRAVENOUS | Status: DC | PRN
  Administered 2018-08-29: 200 mg via INTRAVENOUS

## 2018-08-29 MED ORDER — KETOROLAC TROMETHAMINE 30 MG/ML IJ SOLN
INTRAMUSCULAR | Status: DC | PRN
  Administered 2018-08-29: 30 mg via INTRAVENOUS

## 2018-08-29 MED ORDER — MIDAZOLAM HCL 2 MG/2ML IJ SOLN
INTRAMUSCULAR | Status: DC | PRN
  Administered 2018-08-29: 2 mg via INTRAVENOUS

## 2018-08-29 MED ORDER — OXYCODONE-ACETAMINOPHEN 5-325 MG PO TABS: 1.0000 | ORAL_TABLET | ORAL | 0 refills | Status: DC | PRN

## 2018-08-29 MED ORDER — LIDOCAINE 2% (20 MG/ML) 5 ML SYRINGE
INTRAMUSCULAR | Status: DC | PRN
  Administered 2018-08-29: 100 mg via INTRAVENOUS

## 2018-08-29 MED ORDER — ROCURONIUM BROMIDE 10 MG/ML (PF) SYRINGE
PREFILLED_SYRINGE | INTRAVENOUS | Status: DC | PRN
  Administered 2018-08-29: 40 mg via INTRAVENOUS

## 2018-08-29 MED ORDER — FENTANYL CITRATE (PF) 100 MCG/2ML IJ SOLN
25.0000 ug | INTRAMUSCULAR | Status: DC | PRN
  Administered 2018-08-29 (×2): 25 ug via INTRAVENOUS
  Filled 2018-08-29: qty 1

## 2018-08-29 MED ORDER — MEPERIDINE HCL 25 MG/ML IJ SOLN
6.2500 mg | INTRAMUSCULAR | Status: DC | PRN
  Filled 2018-08-29: qty 1

## 2018-08-29 MED ORDER — MIDAZOLAM HCL 2 MG/2ML IJ SOLN
INTRAMUSCULAR | Status: AC
  Filled 2018-08-29: qty 2

## 2018-08-29 MED ORDER — CEFAZOLIN SODIUM-DEXTROSE 2-4 GM/100ML-% IV SOLN
INTRAVENOUS | Status: AC
  Filled 2018-08-29: qty 100

## 2018-08-29 MED ORDER — OXYCODONE HCL 5 MG PO TABS
5.0000 mg | ORAL_TABLET | Freq: Once | ORAL | Status: AC | PRN
  Administered 2018-08-29: 5 mg via ORAL
  Filled 2018-08-29: qty 1

## 2018-08-29 MED ORDER — PROPOFOL 10 MG/ML IV BOLUS
INTRAVENOUS | Status: DC | PRN
  Administered 2018-08-29: 200 mg via INTRAVENOUS

## 2018-08-29 MED ORDER — ALBUTEROL SULFATE HFA 108 (90 BASE) MCG/ACT IN AERS
INHALATION_SPRAY | RESPIRATORY_TRACT | Status: DC | PRN
  Administered 2018-08-29: 3 via RESPIRATORY_TRACT

## 2018-08-29 MED ORDER — FENTANYL CITRATE (PF) 100 MCG/2ML IJ SOLN
INTRAMUSCULAR | Status: DC | PRN
  Administered 2018-08-29 (×4): 50 ug via INTRAVENOUS

## 2018-08-29 MED ORDER — IOPAMIDOL (ISOVUE-370) INJECTION 76%
INTRAVENOUS | Status: DC | PRN
  Administered 2018-08-29: 7 mL

## 2018-08-29 MED ORDER — SODIUM CHLORIDE 0.9 % IR SOLN
Status: DC | PRN
  Administered 2018-08-29: 6000 mL via INTRAVESICAL

## 2018-08-29 MED ORDER — PROPOFOL 10 MG/ML IV BOLUS
INTRAVENOUS | Status: AC
  Filled 2018-08-29: qty 20

## 2018-08-29 SURGICAL SUPPLY — 33 items
BAG DRAIN URO-CYSTO SKYTR STRL (DRAIN) ×5 IMPLANT
BAG DRN ANRFLXCHMBR STRAP LEK (BAG)
BAG DRN UROCATH (DRAIN) ×2
BAG URINE DRAINAGE (UROLOGICAL SUPPLIES) ×1 IMPLANT
BAG URINE LEG 19OZ MD ST LTX (BAG) IMPLANT
CATH FOLEY 3WAY 30CC 22F (CATHETERS) ×3 IMPLANT
CATH INTERMIT  6FR 70CM (CATHETERS) IMPLANT
CLOTH BEACON ORANGE TIMEOUT ST (SAFETY) ×3 IMPLANT
ELECT REM PT RETURN 9FT ADLT (ELECTROSURGICAL)
ELECTRODE REM PT RTRN 9FT ADLT (ELECTROSURGICAL) ×2 IMPLANT
GLOVE BIO SURGEON STRL SZ 6.5 (GLOVE) ×1 IMPLANT
GLOVE BIO SURGEON STRL SZ7.5 (GLOVE) ×1 IMPLANT
GLOVE BIO SURGEON STRL SZ8 (GLOVE) ×3 IMPLANT
GLOVE BIOGEL PI IND STRL 6.5 (GLOVE) IMPLANT
GLOVE BIOGEL PI INDICATOR 6.5 (GLOVE) ×1
GOWN STRL REUS W/TWL XL LVL3 (GOWN DISPOSABLE) ×3 IMPLANT
GUIDEWIRE ANG ZIPWIRE 038X150 (WIRE) ×1 IMPLANT
GUIDEWIRE STR DUAL SENSOR (WIRE) IMPLANT
GUIDEWIRE ZIPWRE .038 STRAIGHT (WIRE) ×3 IMPLANT
HOLDER FOLEY CATH W/STRAP (MISCELLANEOUS) ×1 IMPLANT
IV NS IRRIG 3000ML ARTHROMATIC (IV SOLUTION) ×4 IMPLANT
KIT TURNOVER CYSTO (KITS) ×3 IMPLANT
LOOP CUT BIPOLAR 24F LRG (ELECTROSURGICAL) ×1 IMPLANT
MANIFOLD NEPTUNE II (INSTRUMENTS) ×3 IMPLANT
NS IRRIG 500ML POUR BTL (IV SOLUTION) ×3 IMPLANT
PACK CYSTO (CUSTOM PROCEDURE TRAY) ×3 IMPLANT
PLUG CATH AND CAP STER (CATHETERS) ×1 IMPLANT
STENT POLARIS LOOP 6FR X 26 CM (STENTS) ×1 IMPLANT
SYR 10ML LL (SYRINGE) ×3 IMPLANT
SYR 30ML LL (SYRINGE) ×3 IMPLANT
SYRINGE IRR TOOMEY STRL 70CC (SYRINGE) ×1 IMPLANT
TUBE CONNECTING 12X1/4 (SUCTIONS) ×3 IMPLANT
TUBING UROLOGY SET (TUBING) ×3 IMPLANT

## 2018-08-29 NOTE — Progress Notes (Signed)
All post op care documented under Nicola Police, RN was actually by Sharmaine Base, RN
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
## 2018-08-29 NOTE — Discharge Instructions (Signed)
°  Post Anesthesia Home Care Instructions  Activity: Get plenty of rest for the remainder of the day. A responsible individual must stay with you for 24 hours following the procedure.  For the next 24 hours, DO NOT: -Drive a car -Paediatric nurse -Drink alcoholic beverages -Take any medication unless instructed by your physician -Make any legal decisions or sign important papers.  Meals: Start with liquid foods such as gelatin or soup. Progress to regular foods as tolerated. Avoid greasy, spicy, heavy foods. If nausea and/or vomiting occur, drink only clear liquids until the nausea and/or vomiting subsides. Call your physician if vomiting continues.  Special Instructions/Symptoms: Your throat may feel dry or sore from the anesthesia or the breathing tube placed in your throat during surgery. If this causes discomfort, gargle with warm salt water. The discomfort should disappear within 24 hours.   Do not take any nonsteroidal anti inflammatories until after 7:00 pm today.

## 2018-08-29 NOTE — Transfer of Care (Signed)
Immediate Anesthesia Transfer of Care Note  Patient: Mark Waller  Procedure(s) Performed: TRANSURETHRAL RESECTION OF BLADDER TUMOR (TURBT) (N/A Bladder) CYSTOSCOPY WITH RETROGRADE PYELOGRAM (Bilateral )  Patient Location: PACU  Anesthesia Type:General  Level of Consciousness: awake, alert , oriented and patient cooperative  Airway & Oxygen Therapy: Patient Spontanous Breathing and Patient connected to nasal cannula oxygen  Post-op Assessment: Report given to RN and Post -op Vital signs reviewed and stable  Post vital signs: Reviewed and stable  Last Vitals:  Vitals Value Taken Time  BP 142/74 08/29/18 1320  Temp    Pulse 78 08/29/18 1322  Resp 12 08/29/18 1322  SpO2 100 % 08/29/18 1322  Vitals shown include unvalidated device data.  Last Pain:  Vitals:   08/29/18 1109  TempSrc: Oral  PainSc: 3       Patients Stated Pain Goal: 7 ([id number])  Complications: No apparent anesthesia complications

## 2018-08-29 NOTE — Anesthesia Procedure Notes (Signed)
Procedure Name: Intubation Date/Time: 08/29/2018 12:39 PM Performed by: Wanita Chamberlain, CRNA Pre-anesthesia Checklist: Patient identified, Emergency Drugs available, Suction available, Patient being monitored and Timeout performed Patient Re-evaluated:Patient Re-evaluated prior to induction Oxygen Delivery Method: Circle system utilized Preoxygenation: Pre-oxygenation with 100% oxygen Induction Type: IV induction Ventilation: Mask ventilation without difficulty Laryngoscope Size: Mac and 3 Grade View: Grade I Tube type: Oral Tube size: 7.5 mm Number of attempts: 1 Airway Equipment and Method: Stylet Placement Confirmation: breath sounds checked- equal and bilateral,  CO2 detector,  positive ETCO2 and ETT inserted through vocal cords under direct vision Secured at: 10 cm Tube secured with: Tape Dental Injury: Teeth and Oropharynx as per pre-operative assessment
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
## 2018-08-29 NOTE — Anesthesia Preprocedure Evaluation (Addendum)
Anesthesia Evaluation  Patient identified by MRN, date of birth, ID band Patient awake    Reviewed: Allergy & Precautions, H&P , NPO status , Patient's Chart, lab work & pertinent test results  Airway Mallampati: I  TM Distance: >3 FB Neck ROM: Full    Dental no notable dental hx. (+) Edentulous Upper, Edentulous Lower   Pulmonary COPD,  COPD inhaler, Current Smoker,    breath sounds clear to auscultation + decreased breath sounds      Cardiovascular hypertension, Pt. on medications + CAD (mild nonobstructive dz 2005 cath) and + Past MI  Normal cardiovascular exam Rhythm:Regular Rate:Normal  Mild nonobstructive disease at last catheterization 07-02-2003 /  last nuclear stress test in epic 12-05-2010 low risk no ischemia, ef 54%   Neuro/Psych PSYCHIATRIC DISORDERS Anxiety CVA, No Residual Symptoms    GI/Hepatic Neg liver ROS, GERD  Medicated and Controlled,  Endo/Other  negative endocrine ROS  Renal/GU negative Renal ROS  negative genitourinary   Musculoskeletal negative musculoskeletal ROS (+)   Abdominal   Peds negative pediatric ROS (+)  Hematology negative hematology ROS (+)   Anesthesia Other Findings   Reproductive/Obstetrics negative OB ROS                          Anesthesia Physical  Anesthesia Plan  ASA: III  Anesthesia Plan: General   Post-op Pain Management:    Induction: Intravenous  PONV Risk Score and Plan: 1 and Treatment may vary due to age or medical condition  Airway Management Planned: Oral ETT and LMA  Additional Equipment:   Intra-op Plan:   Post-operative Plan: Extubation in OR  Informed Consent: I have reviewed the patients History and Physical, chart, labs and discussed the procedure including the risks, benefits and alternatives for the proposed anesthesia with the patient or authorized representative who has indicated his/her understanding and  acceptance.     Dental advisory given and Dental Advisory Given  Plan Discussed with: CRNA, Surgeon and Anesthesiologist  Anesthesia Plan Comments: (  )       Anesthesia Quick Evaluation

## 2018-08-29 NOTE — Op Note (Signed)
Preoperative diagnosis: Bladder Tumor  Postop diagnosis: bladder tumor and left renal pelvis filling defect  Procedure: 1.  Cystoscopy 2. Bilateral retrograde pyelography 3. Intra-operative fluoroscopy, under 1 hour, with interpretation 4.Transurethral resection of bladder tumor, medium 5. Left 6x26 JJ ureteral Stent Placement    Attending: Nicolette Bang  Anesthesia: General  Estimated blood loss: 5 cc  Drains: 1. 22 French Foley catheter 2. Left 6x26 JJ ureteral Stent  Specimens: Bladder tumor  Antibiotics: Ancef  Findings: normal right retrograde pyelogram. Left renal pelvis filling defect concerning for malignancy. Left lateral wall 3cm tumor involving the left ureteral orifice  Indications: Patient is a 58 year old with a history of  bladder tumor found on office cystoscopy.   After discussing treatment options the patient decided to proceed with transurethral resection of bladder tumor  Procedure in detail: Prior to procedure consetn was obtained. Patient was brought to the operating room and briefing was done sure correct patient, correct procedure, correct site.  General anesthesia was in administered patient was placed in the dorsal lithotomy position.  The rigid 45 French cystoscope was passed urethra and bladder.  Bladder was inspected masses or lesions and we noted 3 cm lesion on the left lateal wall involving the left ureteral orifice.  We then cannulated the right ureteral orifice with a 6 French ureteral catheter.  A gentle retrograde was obtained in findings noted above.  We then turned our attention to the left ureteral orifice.  The left ureteral orifice was cannulated with a 6 French ureteral catheter.  A gentle retrograde was obtained in findings noted above.  We then placed a zip wire through the ureteral catheter and advanced up to the renal pelvis. We attmepted ureteroscopy but the caliber of the ureter was too small to accomodate the semirgid ureteroscope. We then  placed a 6 x 26 double-J ureteral stent over the wire.  We then removed the wire and good coil was noted in the pelvis under fluoroscopy in the bladder under direct vision.  Removed the cystoscope and placed a 72 French resectoscope in the bladder.  Using bipolar electrocautery were then removed the bladder tumor.  We removed the bladder tumor down to the base exposing muscle.  We then removed the pieces and sent them for pathology.  To obtain hemostasis we then cauterized the bed of the tumors.  Once good hemostasis was noted the bladder was then drained and a 22 French Foley catheter was placed. This concluded the procedure which resulted by the patient.  Complications: None  Condition: Stable,  extubated, transferred to PACU.  Plan: The patient will be discharged home and followup in 5-7 days for a voiding trial. He will be scheduled for left diagnostic ureteroscopy in 2-3 weeks

## 2018-08-29 NOTE — Anesthesia Postprocedure Evaluation (Signed)
Anesthesia Post Note  Patient: Mark Waller  Procedure(s) Performed: TRANSURETHRAL RESECTION OF BLADDER TUMOR (TURBT) (N/A Bladder) CYSTOSCOPY WITH RETROGRADE PYELOGRAM AND LEFT STENT PLACEMENT (Bilateral Ureter)     Patient location during evaluation: PACU Anesthesia Type: General Level of consciousness: awake and alert Pain management: pain level controlled Vital Signs Assessment: post-procedure vital signs reviewed and stable Respiratory status: spontaneous breathing, nonlabored ventilation, respiratory function stable and patient connected to nasal cannula oxygen Cardiovascular status: blood pressure returned to baseline and stable Postop Assessment: no apparent nausea or vomiting Anesthetic complications: no    Last Vitals:  Vitals:   08/29/18 1109 08/29/18 1320  BP: 124/71 (!) 142/74  Pulse: 64 74  Resp: 16 17  Temp: 36.8 C 36.9 C  SpO2: 100% 100%    Last Pain:  Vitals:   08/29/18 1109  TempSrc: Oral  PainSc: 3                  Virgil Slinger

## 2018-08-30 ENCOUNTER — Encounter (HOSPITAL_BASED_OUTPATIENT_CLINIC_OR_DEPARTMENT_OTHER): Payer: Self-pay | Admitting: Urology

## 2018-09-05 ENCOUNTER — Other Ambulatory Visit: Payer: Self-pay | Admitting: Urology

## 2018-09-15 ENCOUNTER — Other Ambulatory Visit (HOSPITAL_COMMUNITY)
Admission: RE | Admit: 2018-09-15 | Discharge: 2018-09-15 | Disposition: A | Discharge status: Home / Self Care | Payer: Medicare HMO | Source: Ambulatory Visit | Attending: Urology | Admitting: Urology

## 2018-09-15 ENCOUNTER — Other Ambulatory Visit: Payer: Self-pay

## 2018-09-15 DIAGNOSIS — D4112 Neoplasm of uncertain behavior of left renal pelvis: Secondary | ICD-10-CM | POA: Insufficient documentation

## 2018-09-15 DIAGNOSIS — Z20828 Contact with and (suspected) exposure to other viral communicable diseases: Secondary | ICD-10-CM | POA: Diagnosis not present

## 2018-09-15 DIAGNOSIS — Z01812 Encounter for preprocedural laboratory examination: Secondary | ICD-10-CM | POA: Diagnosis present

## 2018-09-15 LAB — SARS CORONAVIRUS 2 (TAT 6-24 HRS): SARS Coronavirus 2: NEGATIVE

## 2018-09-16 ENCOUNTER — Encounter (HOSPITAL_BASED_OUTPATIENT_CLINIC_OR_DEPARTMENT_OTHER): Payer: Self-pay | Admitting: *Deleted

## 2018-09-16 ENCOUNTER — Other Ambulatory Visit: Payer: Self-pay

## 2018-09-16 NOTE — Progress Notes (Addendum)
Spoke w/ pt via phone for pre-op interview.  Npo after mn.  Arrive at 0800.  Needs istat.  Current ekg in chart and epic.  Pt had covid test done yesterday.  Will take am meds and do spiriva am dos w/ sips of water with exception do not take lisinopril, pt verbalized understanding.  Pt denies any cardiac s&s, but does DOE due to COPD.

## 2018-09-18 NOTE — Anesthesia Preprocedure Evaluation (Addendum)
Anesthesia Evaluation  Patient identified by MRN, date of birth, ID band Patient awake    Reviewed: Allergy & Precautions, NPO status , Patient's Chart, lab work & pertinent test results  Airway Mallampati: III  TM Distance: >3 FB Neck ROM: Full    Dental  (+) Edentulous Upper, Edentulous Lower   Pulmonary COPD,  COPD inhaler, Current SmokerPatient did not abstain from smoking.,    Pulmonary exam normal breath sounds clear to auscultation       Cardiovascular hypertension, Pt. on medications Normal cardiovascular exam Rhythm:Regular Rate:Normal  ECG: rate 59. Sinus bradycardia with 1st degree A-V block   Neuro/Psych Anxiety negative neurological ROS     GI/Hepatic Neg liver ROS, GERD  Medicated and Controlled,  Endo/Other  negative endocrine ROS  Renal/GU negative Renal ROS     Musculoskeletal negative musculoskeletal ROS (+)   Abdominal   Peds  Hematology HLD   Anesthesia Other Findings LEFT RENAL PELVIS LESION  Reproductive/Obstetrics                            Anesthesia Physical Anesthesia Plan  ASA: III  Anesthesia Plan: General   Post-op Pain Management:    Induction: Intravenous  PONV Risk Score and Plan: 1 and Ondansetron, Dexamethasone, Midazolam and Treatment may vary due to age or medical condition  Airway Management Planned: LMA  Additional Equipment:   Intra-op Plan:   Post-operative Plan: Extubation in OR  Informed Consent: I have reviewed the patients History and Physical, chart, labs and discussed the procedure including the risks, benefits and alternatives for the proposed anesthesia with the patient or authorized representative who has indicated his/her understanding and acceptance.       Plan Discussed with: CRNA and Anesthesiologist  Anesthesia Plan Comments: (Patient encouraged to shave full beard)      Anesthesia Quick Evaluation

## 2018-09-19 ENCOUNTER — Ambulatory Visit (HOSPITAL_BASED_OUTPATIENT_CLINIC_OR_DEPARTMENT_OTHER): Payer: Medicare HMO | Admitting: Anesthesiology

## 2018-09-19 ENCOUNTER — Encounter (HOSPITAL_BASED_OUTPATIENT_CLINIC_OR_DEPARTMENT_OTHER)
Admission: RE | Disposition: A | Discharge status: Home / Self Care | Payer: Self-pay | Source: Home / Self Care | Attending: Urology

## 2018-09-19 ENCOUNTER — Ambulatory Visit (HOSPITAL_BASED_OUTPATIENT_CLINIC_OR_DEPARTMENT_OTHER)
Admission: RE | Admit: 2018-09-19 | Discharge: 2018-09-19 | Disposition: A | Discharge status: Home / Self Care | Payer: Medicare HMO | Attending: Urology | Admitting: Urology

## 2018-09-19 ENCOUNTER — Encounter (HOSPITAL_BASED_OUTPATIENT_CLINIC_OR_DEPARTMENT_OTHER): Payer: Self-pay

## 2018-09-19 DIAGNOSIS — K219 Gastro-esophageal reflux disease without esophagitis: Secondary | ICD-10-CM | POA: Insufficient documentation

## 2018-09-19 DIAGNOSIS — I2510 Atherosclerotic heart disease of native coronary artery without angina pectoris: Secondary | ICD-10-CM | POA: Diagnosis not present

## 2018-09-19 DIAGNOSIS — F17210 Nicotine dependence, cigarettes, uncomplicated: Secondary | ICD-10-CM | POA: Diagnosis not present

## 2018-09-19 DIAGNOSIS — I10 Essential (primary) hypertension: Secondary | ICD-10-CM | POA: Insufficient documentation

## 2018-09-19 DIAGNOSIS — J449 Chronic obstructive pulmonary disease, unspecified: Secondary | ICD-10-CM | POA: Diagnosis not present

## 2018-09-19 DIAGNOSIS — Z8673 Personal history of transient ischemic attack (TIA), and cerebral infarction without residual deficits: Secondary | ICD-10-CM | POA: Insufficient documentation

## 2018-09-19 DIAGNOSIS — R9341 Abnormal radiologic findings on diagnostic imaging of renal pelvis, ureter, or bladder: Secondary | ICD-10-CM | POA: Diagnosis not present

## 2018-09-19 HISTORY — DX: Malignant neoplasm of bladder, unspecified: C67.9

## 2018-09-19 HISTORY — PX: CYSTOSCOPY/RETROGRADE/URETEROSCOPY: SHX5316

## 2018-09-19 HISTORY — DX: Hematuria, unspecified: R31.9

## 2018-09-19 LAB — POCT I-STAT, CHEM 8
BUN: 16 mg/dL (ref 6–20)
Calcium, Ion: 1.25 mmol/L (ref 1.15–1.40)
Chloride: 107 mmol/L (ref 98–111)
Creatinine, Ser: 0.8 mg/dL (ref 0.61–1.24)
Glucose, Bld: 109 mg/dL — ABNORMAL HIGH (ref 70–99)
HCT: 48 % (ref 39.0–52.0)
Hemoglobin: 16.3 g/dL (ref 13.0–17.0)
Potassium: 4.7 mmol/L (ref 3.5–5.1)
Sodium: 143 mmol/L (ref 135–145)
TCO2: 23 mmol/L (ref 22–32)

## 2018-09-19 SURGERY — CYSTOSCOPY/RETROGRADE/URETEROSCOPY
Anesthesia: General | Laterality: Left

## 2018-09-19 MED ORDER — ONDANSETRON HCL 4 MG/2ML IJ SOLN
INTRAMUSCULAR | Status: AC
  Filled 2018-09-19: qty 2

## 2018-09-19 MED ORDER — OXYCODONE-ACETAMINOPHEN 5-325 MG PO TABS: 1.0000 | ORAL_TABLET | ORAL | 0 refills | Status: DC | PRN

## 2018-09-19 MED ORDER — FENTANYL CITRATE (PF) 100 MCG/2ML IJ SOLN
INTRAMUSCULAR | Status: DC | PRN
  Administered 2018-09-19: 50 ug via INTRAVENOUS

## 2018-09-19 MED ORDER — PROPOFOL 10 MG/ML IV BOLUS
INTRAVENOUS | Status: DC | PRN
  Administered 2018-09-19: 200 mg via INTRAVENOUS

## 2018-09-19 MED ORDER — OXYCODONE HCL 5 MG PO TABS
ORAL_TABLET | ORAL | Status: AC
  Filled 2018-09-19: qty 1

## 2018-09-19 MED ORDER — PROMETHAZINE HCL 25 MG/ML IJ SOLN
6.2500 mg | INTRAMUSCULAR | Status: DC | PRN
  Filled 2018-09-19: qty 1

## 2018-09-19 MED ORDER — ACETAMINOPHEN 500 MG PO TABS
1000.0000 mg | ORAL_TABLET | Freq: Once | ORAL | Status: AC
  Administered 2018-09-19: 1000 mg via ORAL
  Filled 2018-09-19: qty 2

## 2018-09-19 MED ORDER — ALBUTEROL SULFATE HFA 108 (90 BASE) MCG/ACT IN AERS
INHALATION_SPRAY | RESPIRATORY_TRACT | Status: DC | PRN
  Administered 2018-09-19: 3 via RESPIRATORY_TRACT

## 2018-09-19 MED ORDER — ALBUTEROL SULFATE HFA 108 (90 BASE) MCG/ACT IN AERS
INHALATION_SPRAY | RESPIRATORY_TRACT | Status: AC
  Filled 2018-09-19: qty 6.7

## 2018-09-19 MED ORDER — ACETAMINOPHEN 500 MG PO TABS
ORAL_TABLET | ORAL | Status: AC
  Filled 2018-09-19: qty 2

## 2018-09-19 MED ORDER — LIDOCAINE 2% (20 MG/ML) 5 ML SYRINGE
INTRAMUSCULAR | Status: AC
  Filled 2018-09-19: qty 5

## 2018-09-19 MED ORDER — MIDAZOLAM HCL 2 MG/2ML IJ SOLN
INTRAMUSCULAR | Status: AC
  Filled 2018-09-19: qty 2

## 2018-09-19 MED ORDER — PROPOFOL 10 MG/ML IV BOLUS
INTRAVENOUS | Status: AC
  Filled 2018-09-19: qty 20

## 2018-09-19 MED ORDER — DEXAMETHASONE SODIUM PHOSPHATE 4 MG/ML IJ SOLN
INTRAMUSCULAR | Status: DC | PRN
  Administered 2018-09-19: 5 mg via INTRAVENOUS

## 2018-09-19 MED ORDER — EPHEDRINE SULFATE 50 MG/ML IJ SOLN
INTRAMUSCULAR | Status: DC | PRN
  Administered 2018-09-19: 10 mg via INTRAVENOUS

## 2018-09-19 MED ORDER — DEXAMETHASONE SODIUM PHOSPHATE 10 MG/ML IJ SOLN
INTRAMUSCULAR | Status: AC
  Filled 2018-09-19: qty 1

## 2018-09-19 MED ORDER — LACTATED RINGERS IV SOLN
INTRAVENOUS | Status: DC | PRN
  Administered 2018-09-19: 09:00:00 via INTRAVENOUS

## 2018-09-19 MED ORDER — SODIUM CHLORIDE 0.9 % IV SOLN
INTRAVENOUS | Status: DC
  Administered 2018-09-19: 09:00:00 via INTRAVENOUS
  Filled 2018-09-19: qty 1000

## 2018-09-19 MED ORDER — HYDROMORPHONE HCL 1 MG/ML IJ SOLN
INTRAMUSCULAR | Status: AC
  Filled 2018-09-19: qty 1

## 2018-09-19 MED ORDER — FENTANYL CITRATE (PF) 100 MCG/2ML IJ SOLN
INTRAMUSCULAR | Status: AC
  Filled 2018-09-19: qty 2

## 2018-09-19 MED ORDER — LIDOCAINE HCL (CARDIAC) PF 100 MG/5ML IV SOSY
PREFILLED_SYRINGE | INTRAVENOUS | Status: DC | PRN
  Administered 2018-09-19: 60 mg via INTRAVENOUS

## 2018-09-19 MED ORDER — ONDANSETRON HCL 4 MG/2ML IJ SOLN
INTRAMUSCULAR | Status: DC | PRN
  Administered 2018-09-19: 4 mg via INTRAVENOUS

## 2018-09-19 MED ORDER — HYDROMORPHONE HCL 1 MG/ML IJ SOLN
0.2500 mg | INTRAMUSCULAR | Status: DC | PRN
  Administered 2018-09-19: 0.25 mg via INTRAVENOUS
  Filled 2018-09-19: qty 0.5

## 2018-09-19 MED ORDER — MIDAZOLAM HCL 5 MG/5ML IJ SOLN
INTRAMUSCULAR | Status: DC | PRN
  Administered 2018-09-19: 2 mg via INTRAVENOUS

## 2018-09-19 MED ORDER — SODIUM CHLORIDE 0.9 % IV SOLN
INTRAVENOUS | Status: AC
  Filled 2018-09-19: qty 100

## 2018-09-19 MED ORDER — SODIUM CHLORIDE 0.9 % IV SOLN
2.0000 g | INTRAVENOUS | Status: AC
  Administered 2018-09-19: 2 g via INTRAVENOUS
  Filled 2018-09-19: qty 20

## 2018-09-19 MED ORDER — OXYCODONE HCL 5 MG PO TABS
5.0000 mg | ORAL_TABLET | Freq: Once | ORAL | Status: AC
  Administered 2018-09-19: 5 mg via ORAL
  Filled 2018-09-19: qty 1

## 2018-09-19 MED ORDER — CEFTRIAXONE SODIUM 2 G IJ SOLR
INTRAMUSCULAR | Status: AC
  Filled 2018-09-19: qty 20

## 2018-09-19 SURGICAL SUPPLY — 24 items
BAG DRAIN URO-CYSTO SKYTR STRL (DRAIN) ×3 IMPLANT
BAG DRN UROCATH (DRAIN) ×1
BASKET STONE 1.7 NGAGE (UROLOGICAL SUPPLIES) IMPLANT
CATH INTERMIT  6FR 70CM (CATHETERS) ×2 IMPLANT
CLOTH BEACON ORANGE TIMEOUT ST (SAFETY) ×3 IMPLANT
EXTRACTOR STONE 1.7FRX115CM (UROLOGICAL SUPPLIES) IMPLANT
FIBER LASER FLEXIVA 365 (UROLOGICAL SUPPLIES) IMPLANT
FIBER LASER TRAC TIP (UROLOGICAL SUPPLIES) IMPLANT
GLOVE BIO SURGEON STRL SZ8 (GLOVE) ×3 IMPLANT
GOWN STRL REUS W/TWL XL LVL3 (GOWN DISPOSABLE) ×3 IMPLANT
GUIDEWIRE STR DUAL SENSOR (WIRE) ×2 IMPLANT
GUIDEWIRE ZIPWRE .038 STRAIGHT (WIRE) ×3 IMPLANT
IV NS 1000ML (IV SOLUTION) ×3
IV NS 1000ML BAXH (IV SOLUTION) ×1 IMPLANT
IV NS IRRIG 3000ML ARTHROMATIC (IV SOLUTION) ×3 IMPLANT
KIT TURNOVER CYSTO (KITS) ×3 IMPLANT
MANIFOLD NEPTUNE II (INSTRUMENTS) ×3 IMPLANT
NS IRRIG 500ML POUR BTL (IV SOLUTION) ×3 IMPLANT
PACK CYSTO (CUSTOM PROCEDURE TRAY) ×3 IMPLANT
STENT URET 6FRX26 CONTOUR (STENTS) IMPLANT
SYR 10ML LL (SYRINGE) ×3 IMPLANT
TUBE CONNECTING 12'X1/4 (SUCTIONS)
TUBE CONNECTING 12X1/4 (SUCTIONS) IMPLANT
TUBING UROLOGY SET (TUBING) ×3 IMPLANT

## 2018-09-19 NOTE — Anesthesia Postprocedure Evaluation (Signed)
Anesthesia Post Note  Patient: Mark Waller  Procedure(s) Performed: CYSTOSCOPY/URETEROSCOPY (Left )     Patient location during evaluation: PACU Anesthesia Type: General Level of consciousness: awake and alert Pain management: pain level controlled Vital Signs Assessment: post-procedure vital signs reviewed and stable Respiratory status: spontaneous breathing, nonlabored ventilation, respiratory function stable and patient connected to nasal cannula oxygen Cardiovascular status: blood pressure returned to baseline and stable Postop Assessment: no apparent nausea or vomiting Anesthetic complications: no    Last Vitals:  Vitals:   09/19/18 1100 09/19/18 1205  BP: 136/68 129/74  Pulse: (!) 55 (!) 54  Resp: 17 13  Temp:  36.4 C  SpO2: 95% 98%    Last Pain:  Vitals:   09/19/18 1205  TempSrc:   PainSc: 2                  Malaisha Silliman P Di Jasmer

## 2018-09-19 NOTE — Anesthesia Procedure Notes (Signed)
Procedure Name: LMA Insertion Date/Time: 09/19/2018 9:47 AM Performed by: Wanita Chamberlain, CRNA Pre-anesthesia Checklist: Patient identified, Emergency Drugs available, Suction available and Patient being monitored Patient Re-evaluated:Patient Re-evaluated prior to induction Oxygen Delivery Method: Circle system utilized Preoxygenation: Pre-oxygenation with 100% oxygen Induction Type: IV induction Ventilation: Mask ventilation without difficulty LMA: LMA inserted LMA Size: 4.0 Number of attempts: 1

## 2018-09-19 NOTE — Op Note (Signed)
Preoperative diagnosis: Left renal pelvis filling defect  Postoperative diagnosis: no renal pelvis tumor  Procedure: 1 cystoscopy 2.  Intraoperative fluoroscopy, under one hour, with interpretation 3.  Left diagnostic ureteroscopy 4. Left stent removal  Attending: Rosie Fate  Anesthesia: General  Estimated blood loss: None  Drains: none  Specimens: none  Antibiotics: rocephin  Findings: No hydronephrosis. No masses/lesions visualized on diagnostic ureteroscopy of the left ureter and left renal pelvis..  Indications: Patient is a 58 year old male with a history of bladder cancer and left renal pelvis filling defect. Ureteroscopy was unable to be performed last procedure and a stent was placed.  After discussing treatment options, he decided proceed with left diagnostic ureteroscopy  Procedure her in detail: The patient was brought to the operating room and a brief timeout was done to ensure correct patient, correct procedure, correct site.  General anesthesia was administered patient was placed in dorsal lithotomy position.  His  genitalia was then prepped and draped in usual sterile fashion.  A rigid 94 French cystoscope was passed in the urethra and the bladder.  Bladder was inspected free masses or lesions. Previous bladder tumor region with fibrinous material over top of resection site.  ureteral orifices were in the normal orthotopic locations.  Using a grasper the left stent was brought to the urethral meatus. we then placed a zip wire through the ureteral stent and advanced up to the renal pelvis.  we then removed the cystoscope and cannulated the left ureteral orifice with a semirigid ureteroscope.  we then performed ureteroscopy up to the level of the UPJ. No stone or tumor was encountered. A sensor wire was advanced through the sope and into the renal pelvis. The semirigid was removed and a flexible ureteroscope was advanced over the senor wire. We then performed nephroscopy  and noted no tumor. We then removed the scope and the wire. We elected to not leave a stent since it was an uncomplicated ureteroscopy. the bladder was then drained and this concluded the procedure which was well tolerated by patient.  Complications: None  Condition: Stable, extubated, transferred to PACU  Plan: Pt is to followup in 2 weeks

## 2018-09-19 NOTE — Interval H&P Note (Signed)
History and Physical Interval Note:  09/19/2018 9:30 AM  Mark Waller  has presented today for surgery, with the diagnosis of LEFT RENAL PELVIS LESION.  The various methods of treatment have been discussed with the patient and family. After consideration of risks, benefits and other options for treatment, the patient has consented to  Procedure(s) with comments: CYSTOSCOPY/RETROGRADE/URETEROSCOPY (Left) - 30 MINS as a surgical intervention.  The patient's history has been reviewed, patient examined, no change in status, stable for surgery.  I have reviewed the patient's chart and labs.  Questions were answered to the patient's satisfaction.     Nicolette Bang

## 2018-09-19 NOTE — Discharge Instructions (Signed)
Ureteroscopy, Care After This sheet gives you information about how to care for yourself after your procedure. Your health care provider may also give you more specific instructions. If you have problems or questions, contact your health care provider. What can I expect after the procedure? After the procedure, it is common to have:  A burning sensation when you urinate.  Blood in your urine.  Mild discomfort in the bladder area or kidney area when urinating.  Needing to urinate more often or urgently. Follow these instructions at home:  Medicines  Take over-the-counter and prescription medicines only as told by your health care provider.  If you were prescribed an antibiotic medicine, take it as told by your health care provider. Do not stop taking the antibiotic even if you start to feel better. General instructions  Donot drive for 24 hours if you were given a medicine to help you relax (sedative) during your procedure.  To relieve burning, try taking a warm bath or holding a warm washcloth over your groin.  Drink enough fluid to keep your urine clear or pale yellow. ? Drink two 8-ounce glasses of water every hour for the first 2 hours after you get home. ? Continue to drink water often at home.  You can eat what you usually do.  Keep all follow-up visits as told by your health care provider. This is important. ? If you had a tube placed to keep urine flowing (ureteral stent), ask your health care provider when you need to return to have it removed. Contact a health care provider if:  You have chills or a fever.  You have burning pain for longer than 24 hours after the procedure.  You have blood in your urine for longer than 24 hours after the procedure. Get help right away if:  You have large amounts of blood in your urine.  You have blood clots in your urine.  You have very bad pain.  You have chest pain or trouble breathing.  You are unable to urinate and you  have the feeling of a full bladder. This information is not intended to replace advice given to you by your health care provider. Make sure you discuss any questions you have with your health care provider. Document Released: 12/27/2012 Document Revised: 12/04/2016 Document Reviewed: 10/04/2015 Elsevier Patient Education  [place] Instructions  Activity: Get plenty of rest for the remainder of the day. A responsible adult should stay with you for 24 hours following the procedure.  For the next 24 hours, DO NOT: -Drive a car -Paediatric nurse -Drink alcoholic beverages -Take any medication unless instructed by your physician -Make any legal decisions or sign important papers.  Meals: Start with liquid foods such as gelatin or soup. Progress to regular foods as tolerated. Avoid greasy, spicy, heavy foods. If nausea and/or vomiting occur, drink only clear liquids until the nausea and/or vomiting subsides. Call your physician if vomiting continues.  Special Instructions/Symptoms: Your throat may feel dry or sore from the anesthesia or the breathing tube placed in your throat during surgery. If this causes discomfort, gargle with warm salt water. The discomfort should disappear within 24 hours.  If you had a scopolamine patch placed behind your ear for the management of post- operative nausea and/or vomiting:  1. The medication in the patch is effective for 72 hours, after which it should be removed.  Wrap patch in a tissue and discard in the trash. Wash  hands thoroughly with soap and water. 2. You may remove the patch earlier than 72 hours if you experience unpleasant side effects which may include dry mouth, dizziness or visual disturbances. 3. Avoid touching the patch. Wash your hands with soap and water after contact with the patch.

## 2018-09-19 NOTE — Transfer of Care (Signed)
Immediate Anesthesia Transfer of Care Note  Patient: Mark Waller  Procedure(s) Performed: CYSTOSCOPY/URETEROSCOPY (Left )  Patient Location: PACU  Anesthesia Type:General  Level of Consciousness: awake, alert , oriented and patient cooperative  Airway & Oxygen Therapy: Patient Spontanous Breathing and Patient connected to nasal cannula oxygen  Post-op Assessment: Report given to RN and Post -op Vital signs reviewed and stable  Post vital signs: Reviewed and stable  Last Vitals:  Vitals Value Taken Time  BP    Temp    Pulse 66 09/19/18 1025  Resp 13 09/19/18 1025  SpO2 98 % 09/19/18 1025  Vitals shown include unvalidated device data.  Last Pain:  Vitals:   09/19/18 0826  TempSrc: Oral  PainSc: 0-No pain         Complications: No apparent anesthesia complications

## 2018-09-20 ENCOUNTER — Encounter (HOSPITAL_BASED_OUTPATIENT_CLINIC_OR_DEPARTMENT_OTHER): Payer: Self-pay | Admitting: Urology

## 2018-09-20 NOTE — Progress Notes (Signed)
Left message

## 2019-01-11 ENCOUNTER — Ambulatory Visit (INDEPENDENT_AMBULATORY_CARE_PROVIDER_SITE_OTHER): Payer: Medicare HMO | Admitting: Urology

## 2019-01-11 ENCOUNTER — Other Ambulatory Visit: Payer: Self-pay

## 2019-01-11 VITALS — BP 138/83 | HR 71 | Temp 96.1°F | Ht 72.0 in | Wt 170.0 lb

## 2019-01-11 DIAGNOSIS — C679 Malignant neoplasm of bladder, unspecified: Secondary | ICD-10-CM | POA: Diagnosis not present

## 2019-07-12 ENCOUNTER — Ambulatory Visit (INDEPENDENT_AMBULATORY_CARE_PROVIDER_SITE_OTHER): Payer: Medicare HMO | Admitting: Urology

## 2019-07-12 ENCOUNTER — Other Ambulatory Visit: Payer: Medicare HMO | Admitting: Urology

## 2019-07-12 ENCOUNTER — Encounter: Payer: Self-pay | Admitting: Urology

## 2019-07-12 ENCOUNTER — Other Ambulatory Visit: Payer: Self-pay

## 2019-07-12 VITALS — BP 115/70 | HR 66 | Temp 97.7°F | Ht 72.0 in | Wt 170.0 lb

## 2019-07-12 DIAGNOSIS — C679 Malignant neoplasm of bladder, unspecified: Secondary | ICD-10-CM

## 2019-07-12 LAB — POCT URINALYSIS DIPSTICK
Bilirubin, UA: NEGATIVE
Blood, UA: NEGATIVE
Glucose, UA: NEGATIVE
Ketones, UA: NEGATIVE
Nitrite, UA: NEGATIVE
Protein, UA: POSITIVE — AB
Spec Grav, UA: 1.01 (ref 1.010–1.025)
Urobilinogen, UA: 0.2 E.U./dL
pH, UA: 6 (ref 5.0–8.0)

## 2019-07-12 MED ORDER — CIPROFLOXACIN HCL 500 MG PO TABS
500.0000 mg | ORAL_TABLET | Freq: Once | ORAL | Status: AC
  Administered 2019-07-12: 500 mg via ORAL

## 2019-07-12 NOTE — Patient Instructions (Signed)
Bladder Cancer  Bladder cancer is an abnormal growth of tissue in the bladder. The bladder is the balloon-like sac in the pelvis. It collects and stores urine that comes from the kidneys through the ureters. The bladder wall is made of layers. If cancer spreads into these layers and through the wall of the bladder, it becomes more difficult to treat. What are the causes? The cause of this condition is not known. What increases the risk? The following factors may make you more likely to develop this condition:  Smoking.  Workplace risks (occupational exposures), such as rubber, leather, textile, dyes, chemicals, and paint.  Being white.  Your age. Most people with bladder cancer are over the age of 55.  Being male.  Having chronic bladder inflammation.  Having a personal history of bladder cancer.  Having a family history of bladder cancer (heredity).  Having had chemotherapy or radiation therapy to the pelvis.  Having been exposed to arsenic. What are the signs or symptoms? Initial symptoms of this condition include:  Blood in the urine.  Painful urination.  Frequent bladder or urine infections.  Increase in urgency and frequency of urination. Advanced symptoms of this condition include:  Not being able to urinate.  Low back pain on one side.  Loss of appetite.  Weight loss.  Fatigue.  Swelling in the feet.  Bone pain. How is this diagnosed? This condition is diagnosed based on your medical history, a physical exam, urine tests, lab tests, imaging tests, and your symptoms. You may also have other tests or procedures done, such as:  A narrow tube being inserted into your bladder through your urethra (cystoscopy) in order to view the lining of your bladder for tumors.  A biopsy to sample the tumor to see if cancer is present. If cancer is present, it will then be staged to determine its severity and extent. Staging is an assessment of:  The size of the  tumor.  Whether the cancer has spread.  Where the cancer has spread. It is important to know how deeply into the bladder wall cancer has grown and whether cancer has spread to any other parts of your body. Staging may require blood tests or imaging tests, such as a CT scan, MRI, bone scan, or chest X-ray. How is this treated? Based on the stage of cancer, one treatment or a combination of treatments may be recommended. The most common forms of treatment are:  Surgery to remove the cancer. Procedures that may be done include transurethral resection and cystectomy.  Radiation therapy. This is high-energy X-rays or other particles. This is often used in combination with chemotherapy.  Chemotherapy. During this treatment, medicines are used to kill cancer cells.  Immunotherapy. This uses medicines to help your own immune system destroy cancer cells. Follow these instructions at home:  Take over-the-counter and prescription medicines only as told by your health care provider.  Maintain a healthy diet. Some of your treatments might affect your appetite.  Consider joining a support group. This may help you learn to cope with the stress of having bladder cancer.  Tell your cancer care team if you develop side effects. They may be able to recommend ways to relieve them.  Keep all follow-up visits as told by your health care provider. This is important. Where to find more information  American Cancer Society: www.cancer.org  National Cancer Institute (NCI): www.cancer.gov Contact a health care provider if:  You have symptoms of a urinary tract infection. These include: ?   Fever. ? Chills. ? Weakness. ? Muscle aches. ? Abdominal pain. ? Frequent and intense urge to urinate. ? Burning feeling in the bladder or urethra during urination. Get help right away if:  There is blood in your urine.  You cannot urinate.  You have severe pain or other symptoms that do not go  away. Summary  Bladder cancer is an abnormal growth of tissue in the bladder.  This condition is diagnosed based on your medical history, a physical exam, urine tests, lab tests, imaging tests, and your symptoms.  Based on the stage of cancer, surgery, chemotherapy, or a combination of treatments may be recommended.  Consider joining a support group. This may help you learn to cope with the stress of having bladder cancer. This information is not intended to replace advice given to you by your health care provider. Make sure you discuss any questions you have with your health care provider. Document Revised: 12/04/2016 Document Reviewed: 11/26/2015 Elsevier Patient Education  2020 Elsevier Inc[employer]  

## 2019-07-12 NOTE — Progress Notes (Signed)
Urological Symptom Review  Patient is experiencing the following symptoms: Get up at night to urinate Injury to kidneys/bladder   Review of Systems  Gastrointestinal (upper)  : Negative for upper GI symptoms  Gastrointestinal (lower) : Negative for lower GI symptoms  Constitutional : Negative for symptoms  Skin: Negative for skin symptoms  Eyes: Negative for eye symptoms  Ear/Nose/Throat : Sinus problems  Hematologic/Lymphatic: Negative for Hematologic/Lymphatic symptoms  Cardiovascular : Negative for cardiovascular symptoms  Respiratory : Shortness of breath  Endocrine: Negative for endocrine symptoms  Musculoskeletal: Joint pain  Neurological: Negative for neurological symptoms  Psychologic: Negative for psychiatric symptoms

## 2019-07-12 NOTE — Progress Notes (Signed)
   07/12/19  CC:  Hx of bladder cancer  HPI: Mr Leaming is a 59yo here for folowup for TaG1 bladder cancer. No worsening LUTS  Blood pressure 115/70, pulse 66, temperature 97.7 F (36.5 C), height 6' (1.829 m), weight 170 lb (77.1 kg). NED. A&Ox3.   No respiratory distress   Abd soft, NT, ND Normal phallus with bilateral descended testicles  Cystoscopy Procedure Note  Patient identification was confirmed, informed consent was obtained, and patient was prepped using Betadine solution.  Lidocaine jelly was administered per urethral meatus.     Pre-Procedure: - Inspection reveals a normal caliber ureteral meatus.  Procedure: The flexible cystoscope was introduced without difficulty - No urethral strictures/lesions are present. - Enlarged prostate  - Normal bladder neck - Bilateral ureteral orifices identified - Bladder mucosa  reveals no ulcers, tumors, or lesions - No bladder stones - No trabeculation  Retroflexion shows 1cm intravesical prostatic protrusion   Post-Procedure: - Patient tolerated the procedure well  Assessment/ Plan:   Return in about 6 months (around 01/12/2020) for cystoscopy.  Nicolette Bang, MD

## 2019-12-21 ENCOUNTER — Other Ambulatory Visit: Payer: Self-pay | Admitting: Urology

## 2020-01-17 ENCOUNTER — Encounter: Payer: Self-pay | Admitting: Urology

## 2020-01-17 ENCOUNTER — Ambulatory Visit (INDEPENDENT_AMBULATORY_CARE_PROVIDER_SITE_OTHER): Payer: Medicare HMO | Admitting: Urology

## 2020-01-17 ENCOUNTER — Other Ambulatory Visit: Payer: Self-pay

## 2020-01-17 VITALS — BP 107/64 | HR 67 | Temp 98.4°F | Ht 72.0 in | Wt 175.0 lb

## 2020-01-17 DIAGNOSIS — C679 Malignant neoplasm of bladder, unspecified: Secondary | ICD-10-CM

## 2020-01-17 LAB — URINALYSIS, ROUTINE W REFLEX MICROSCOPIC
Bilirubin, UA: NEGATIVE
Glucose, UA: NEGATIVE
Leukocytes,UA: NEGATIVE
Nitrite, UA: NEGATIVE
Specific Gravity, UA: 1.02 (ref 1.005–1.030)
Urobilinogen, Ur: 4 mg/dL — ABNORMAL HIGH (ref 0.2–1.0)
pH, UA: 6 (ref 5.0–7.5)

## 2020-01-17 LAB — MICROSCOPIC EXAMINATION
Bacteria, UA: NONE SEEN
Epithelial Cells (non renal): NONE SEEN /hpf (ref 0–10)
Renal Epithel, UA: NONE SEEN /hpf
WBC, UA: NONE SEEN /hpf (ref 0–5)

## 2020-01-17 MED ORDER — SULFAMETHOXAZOLE-TRIMETHOPRIM 800-160 MG PO TABS: 1.0000 | ORAL_TABLET | Freq: Once | ORAL | 0 refills | Status: AC

## 2020-01-17 MED ORDER — TAMSULOSIN HCL 0.4 MG PO CAPS: 0.4000 mg | ORAL_CAPSULE | Freq: Every day | ORAL | 3 refills | Status: DC

## 2020-01-17 MED ORDER — TADALAFIL 20 MG PO TABS: 20.0000 mg | ORAL_TABLET | Freq: Every day | ORAL | 5 refills | Status: DC | PRN

## 2020-01-17 NOTE — Patient Instructions (Signed)
Cystoscopy Cystoscopy is a procedure that is used to help diagnose and sometimes treat conditions that affect the lower urinary tract. The lower urinary tract includes the bladder and the urethra. The urethra is the tube that drains urine from the bladder. Cystoscopy is done using a thin, tube-shaped instrument with a light and camera at the end (cystoscope). The cystoscope may be hard or flexible, depending on the goal of the procedure. The cystoscope is inserted through the urethra, into the bladder. Cystoscopy may be recommended if you have:  Urinary tract infections that keep coming back.  Blood in the urine (hematuria).  An inability to control when you urinate (urinary incontinence) or an overactive bladder.  Unusual cells found in a urine sample.  A blockage in the urethra, such as a urinary stone.  Painful urination.  An abnormality in the bladder found during an intravenous pyelogram (IVP) or CT scan. Cystoscopy may also be done to remove a sample of tissue to be examined under a microscope (biopsy). Tell a health care provider about:  Any allergies you have.  All medicines you are taking, including vitamins, herbs, eye drops, creams, and over-the-counter medicines.  Any problems you or family members have had with anesthetic medicines.  Any blood disorders you have.  Any surgeries you have had.  Any medical conditions you have.  Whether you are pregnant or may be pregnant. What are the risks? Generally, this is a safe procedure. However, problems may occur, including:  Infection.  Bleeding.  Allergic reactions to medicines.  Damage to other structures or organs. What happens before the procedure? Medicines Ask your health care provider about:  Changing or stopping your regular medicines. This is especially important if you are taking diabetes medicines or blood thinners.  Taking medicines such as aspirin and ibuprofen. These medicines can thin your blood. Do  not take these medicines unless your health care provider tells you to take them.  Taking over-the-counter medicines, vitamins, herbs, and supplements. Tests You may have an exam or testing, such as:  X-rays of the bladder, urethra, or kidneys.  CT scan of the abdomen or pelvis.  Urine tests to check for signs of infection. General instructions  Follow instructions from your health care provider about eating or drinking restrictions.  Ask your health care provider what steps will be taken to help prevent infection. These steps may include: ? Washing skin with a germ-killing soap. ? Taking antibiotic medicine.  Plan to have a responsible adult take you home from the hospital or clinic. What happens during the procedure?  You will be given one or more of the following: ? A medicine to help you relax (sedative). ? A medicine to numb the area (local anesthetic).  The area around the opening of your urethra will be cleaned.  The cystoscope will be passed through your urethra into your bladder.  Germ-free (sterile) fluid will flow through the cystoscope to fill your bladder. The fluid will stretch your bladder so that your health care provider can clearly examine your bladder walls.  Your doctor will look at the urethra and bladder. Your doctor may take a biopsy or remove stones.  The cystoscope will be removed, and your bladder will be emptied. The procedure may vary among health care providers and hospitals.   What can I expect after the procedure? After the procedure, it is common to have:  Some soreness or pain in your abdomen and urethra.  Urinary symptoms. These include: ? Mild pain or burning  when you urinate. Pain should stop within a few minutes after you urinate. This may last for up to 1 week. ? A small amount of blood in your urine for several days. ? Feeling like you need to urinate but producing only a small amount of urine. Follow these instructions at  home: Medicines  Take over-the-counter and prescription medicines only as told by your health care provider.  If you were prescribed an antibiotic medicine, take it as told by your health care provider. Do not stop taking the antibiotic even if you start to feel better. General instructions  Return to your normal activities as told by your health care provider. Ask your health care provider what activities are safe for you.  If you were given a sedative during the procedure, it can affect you for several hours. Do not drive or operate machinery until your health care provider says that it is safe.  Watch for any blood in your urine. If the amount of blood in your urine increases, call your health care provider.  Follow instructions from your health care provider about eating or drinking restrictions.  If a tissue sample was removed for testing (biopsy) during your procedure, it is up to you to get your test results. Ask your health care provider, or the department that is doing the test, when your results will be ready.  Drink enough fluid to keep your urine pale yellow.  Keep all follow-up visits. This is important. Contact a health care provider if:  You have pain that gets worse or does not get better with medicine, especially pain when you urinate.  You have trouble urinating.  You have more blood in your urine. Get help right away if:  You have blood clots in your urine.  You have abdominal pain.  You have a fever or chills.  You are unable to urinate. Summary  Cystoscopy is a procedure that is used to help diagnose and sometimes treat conditions that affect the lower urinary tract.  Cystoscopy is done using a thin, tube-shaped instrument with a light and camera at the end.  After the procedure, it is common to have some soreness or pain in your abdomen and urethra.  Watch for any blood in your urine. If the amount of blood in your urine increases, call your health  care provider.  If you were prescribed an antibiotic medicine, take it as told by your health care provider. Do not stop taking the antibiotic even if you start to feel better. This information is not intended to replace advice given to you by your health care provider. Make sure you discuss any questions you have with your health care provider. Document Revised: 08/04/2019 Document Reviewed: 08/04/2019 Elsevier Patient Education  [place].

## 2020-01-17 NOTE — Progress Notes (Signed)
Urological Symptom Review  Patient is experiencing the following symptoms: Get up at night to urinate Erection problems (male only)   Review of Systems  Gastrointestinal (upper)  : Negative for upper GI symptoms  Gastrointestinal (lower) : Negative for lower GI symptoms  Constitutional : Negative for symptoms  Skin: Negative for skin symptoms  Eyes: Negative for eye symptoms  Ear/Nose/Throat : Sinus problems  Hematologic/Lymphatic: Negative for Hematologic/Lymphatic symptoms  Cardiovascular : Negative for cardiovascular symptoms  Respiratory : Cough Shortness of breath  Endocrine: Negative for endocrine symptoms  Musculoskeletal: Back pain Joint pain  Neurological: Negative for neurological symptoms  Psychologic: Anxiety

## 2020-01-17 NOTE — Addendum Note (Signed)
Addended by: Cleon Gustin on: 01/17/2020 10:38 AM   Modules accepted: Orders

## 2020-01-17 NOTE — Progress Notes (Signed)
   01/17/20  CC: followup bladder cancer  HPI: [name] is a 60yo here for followup for TaG1 bladder cancer.  Blood pressure 107/64, pulse 67, temperature 98.4 F (36.9 C), height 6' (1.829 m), weight 175 lb (79.4 kg). NED. A&Ox3.   No respiratory distress   Abd soft, NT, ND Normal phallus with bilateral descended testicles  Cystoscopy Procedure Note  Patient identification was confirmed, informed consent was obtained, and patient was prepped using Betadine solution.  Lidocaine jelly was administered per urethral meatus.     Pre-Procedure: - Inspection reveals a normal caliber ureteral meatus.  Procedure: The flexible cystoscope was introduced without difficulty - No urethral strictures/lesions are present. - Enlarged prostate  - Normal bladder neck - Bilateral ureteral orifices identified - Bladder mucosa  reveals no ulcers, tumors, or lesions - No bladder stones - mild trabeculation  Retroflexion shows no intravesical prostatic protrusion   Post-Procedure: - Patient tolerated the procedure well  Assessment/ Plan: Followup 6 months with cystoscopy  Nicolette Bang, MD

## 2020-01-31 ENCOUNTER — Encounter (INDEPENDENT_AMBULATORY_CARE_PROVIDER_SITE_OTHER): Payer: Self-pay | Admitting: *Deleted

## 2020-02-14 ENCOUNTER — Other Ambulatory Visit: Payer: Self-pay | Admitting: Urology

## 2020-05-09 ENCOUNTER — Ambulatory Visit (INDEPENDENT_AMBULATORY_CARE_PROVIDER_SITE_OTHER): Payer: Medicare HMO | Admitting: Gastroenterology

## 2020-07-17 ENCOUNTER — Other Ambulatory Visit: Payer: Medicare HMO | Admitting: Urology

## 2020-08-03 ENCOUNTER — Other Ambulatory Visit: Payer: Self-pay | Admitting: Urology

## 2020-08-06 ENCOUNTER — Other Ambulatory Visit: Payer: Self-pay

## 2020-08-06 MED ORDER — TAMSULOSIN HCL 0.4 MG PO CAPS: 0.4000 mg | ORAL_CAPSULE | Freq: Every day | ORAL | 3 refills | Status: DC

## 2020-12-20 ENCOUNTER — Encounter: Payer: Self-pay | Admitting: Urology

## 2020-12-20 ENCOUNTER — Other Ambulatory Visit: Payer: Self-pay

## 2020-12-20 ENCOUNTER — Ambulatory Visit (INDEPENDENT_AMBULATORY_CARE_PROVIDER_SITE_OTHER): Payer: Medicare HMO | Admitting: Urology

## 2020-12-20 VITALS — BP 100/63 | HR 65

## 2020-12-20 DIAGNOSIS — C679 Malignant neoplasm of bladder, unspecified: Secondary | ICD-10-CM | POA: Diagnosis not present

## 2020-12-20 LAB — URINALYSIS, ROUTINE W REFLEX MICROSCOPIC
Bilirubin, UA: NEGATIVE
Glucose, UA: NEGATIVE
Ketones, UA: NEGATIVE
Leukocytes,UA: NEGATIVE
Nitrite, UA: NEGATIVE
RBC, UA: NEGATIVE
Specific Gravity, UA: 1.02 (ref 1.005–1.030)
Urobilinogen, Ur: 2 mg/dL — ABNORMAL HIGH (ref 0.2–1.0)
pH, UA: 7 (ref 5.0–7.5)

## 2020-12-20 LAB — MICROSCOPIC EXAMINATION
Bacteria, UA: NONE SEEN
RBC, Urine: NONE SEEN /hpf (ref 0–2)
Renal Epithel, UA: NONE SEEN /hpf
WBC, UA: NONE SEEN /hpf (ref 0–5)

## 2020-12-20 NOTE — Progress Notes (Signed)
Urological Symptom Review  Patient is experiencing the following symptoms: Erection problems (male only)   Review of Systems  Gastrointestinal (upper)  : Negative for upper GI symptoms  Gastrointestinal (lower) : Negative for lower GI symptoms  Constitutional : Negative for symptoms  Skin: Negative for skin symptoms  Eyes: Negative for eye symptoms  Ear/Nose/Throat : Sinus problems  Hematologic/Lymphatic: Negative for Hematologic/Lymphatic symptoms  Cardiovascular : Negative for cardiovascular symptoms  Respiratory : Cough Shortness of breath  Endocrine: Negative for endocrine symptoms  Musculoskeletal: Joint pain  Neurological: Negative for neurological symptoms  Psychologic: Negative for psychiatric symptoms

## 2020-12-20 NOTE — Patient Instructions (Signed)
Bladder Cancer Bladder cancer is a condition in which abnormal tissue (a tumor) grows in the bladder. The bladder is the organ that holds urine. Two tubes (ureters) carry the urine from the kidneys to the bladder. The bladder wall is made of layers of tissue. Cancer that spreads through these layers of the bladder wall becomes more difficult to treat. What are the causes? The cause of this condition is not known. What increases the risk? The following factors may make you more likely to develop this condition: Smoking. Working where there are risks (occupational exposures), such as working with rubber, leather, clothing fabric, dyes, chemicals, and paint. Being 62 years of age or older. Being male. Having bladder inflammation that is long-term (chronic). Having a history of cancer, including: A family history of bladder cancer. Personal experience with bladder cancer. Having had certain treatments for cancer before. These include: Medicines to kill cancer cells (chemotherapy). Strong X-ray beams or capsules high in energy to kill cancer cells and shrink tumors (radiation therapy). Having been exposed to arsenic. This is a chemical element that can poison you. What are the signs or symptoms? Early symptoms of this condition include: Seeing blood in your urine. Feeling pain when urinating. Having infections of your urinary system (urinary tract infections or UTIs) that happen often. Having to urinate sooner or more often than usual. Later symptoms of this condition include: Not being able to urinate. Pain on one side of your lower back. Loss of appetite. Weight loss. Tiredness (fatigue). Swelling in your feet. Bone pain. How is this diagnosed? This condition is diagnosed based on: Your medical history. A physical exam. Lab tests, such as urine tests. Imaging tests. Your symptoms. You may also have other tests or procedures done, such as: A cystoscopy. A narrow tube is inserted  into your bladder through the organ that connects your bladder to the outside of your body (urethra). This is done to view the lining of your bladder for tumors. A biopsy. This procedure involves removing a tissue sample to look at it under a microscope to see if cancer is present. It is important to find out: How deeply into the bladder wall cancer has grown. Whether cancer has spread to any other parts of your body. This may require blood tests or imaging tests, such as a CT scan, MRI, bone scan, or X-rays. How is this treated? Your health care provider may recommend one or more types of treatment based on the stage of your cancer. The most common types of treatment are: Surgery to remove the cancer. Procedures that may be done include: Removing a tumor on the inside wall of the bladder (transurethral resection). Removing the bladder (cystectomy). Radiation therapy. This is often used together with chemotherapy. Chemotherapy. Immunotherapy. This uses medicines to help your immune system destroy cancer cells. Follow these instructions at home: Take over-the-counter and prescription medicines only as told by your health care provider. Eat a healthy diet. Some of your treatments might affect your appetite. Do not use any products that contain nicotine or tobacco, such as cigarettes, e-cigarettes, and chewing tobacco. If you need help quitting, ask your health care provider. Consider joining a support group. This may help you learn to cope with the stress of having bladder cancer. Tell your cancer care team if you develop side effects. Your team may be able to recommend ways to get relief. Keep all follow-up visits as told by your health care provider. This is important. Where to find more information American   Cancer Society: www.cancer.org National Cancer Institute (NCI): www.cancer.gov Contact a health care provider if: You have symptoms of a urinary tract infection. These  include: Fever. Chills. Weakness. Muscle aches. Pain in your abdomen. Urge to urinate that is stronger and happens more often than usual. Burning feeling in the bladder or urethra when you urinate. Get help right away if: There is blood in your urine. You cannot urinate. You have severe pain or other symptoms that do not go away. Summary Bladder cancer is a condition in which tumors grow in the bladder and cause illness. This condition is diagnosed based on your medical history, a physical exam, lab tests, imaging tests, and your symptoms. Your health care provider may recommend one or more types of treatment based on the stage of your cancer. Consider joining a support group. This may help you learn to cope with the stress of having bladder cancer. This information is not intended to replace advice given to you by your health care provider. Make sure you discuss any questions you have with your health care provider. Document Revised: 08/31/2018 Document Reviewed: 08/31/2018 Elsevier Patient Education  2022 [employer]vier Inc.  

## 2020-12-20 NOTE — Progress Notes (Signed)
° °  12/20/20  CC: followup bladder cancer   HPI: Mark Waller is a 60yo here for followup for TaG1 bladder cancer resected in 09/2018. NO hematuria or dysuria Blood pressure 100/63, pulse 65. NED. A&Ox3.   No respiratory distress   Abd soft, NT, ND Normal phallus with bilateral descended testicles  Cystoscopy Procedure Note  Patient identification was confirmed, informed consent was obtained, and patient was prepped using Betadine solution.  Lidocaine jelly was administered per urethral meatus.     Pre-Procedure: - Inspection reveals a normal caliber ureteral meatus.  Procedure: The flexible cystoscope was introduced without difficulty - No urethral strictures/lesions are present. - Enlarged prostate  - Normal bladder neck - Bilateral ureteral orifices identified - Bladder mucosa  reveals no ulcers, tumors, or lesions - No bladder stones - No trabeculation     Post-Procedure: - Patient tolerated the procedure well  Assessment/ Plan:  RTC 1 year for cystoscopy  Nicolette Bang, MD

## 2021-01-20 ENCOUNTER — Encounter: Payer: Self-pay | Admitting: Internal Medicine

## 2021-03-19 ENCOUNTER — Ambulatory Visit: Payer: Medicare HMO

## 2021-09-06 DIAGNOSIS — F1010 Alcohol abuse, uncomplicated: Secondary | ICD-10-CM | POA: Insufficient documentation

## 2021-09-06 DIAGNOSIS — I639 Cerebral infarction, unspecified: Secondary | ICD-10-CM | POA: Insufficient documentation

## 2021-09-07 DIAGNOSIS — N289 Disorder of kidney and ureter, unspecified: Secondary | ICD-10-CM | POA: Insufficient documentation

## 2021-09-07 DIAGNOSIS — R748 Abnormal levels of other serum enzymes: Secondary | ICD-10-CM | POA: Insufficient documentation

## 2021-10-09 DIAGNOSIS — I350 Nonrheumatic aortic (valve) stenosis: Secondary | ICD-10-CM | POA: Insufficient documentation

## 2021-12-23 ENCOUNTER — Other Ambulatory Visit: Payer: Medicare HMO | Admitting: Urology

## 2022-02-04 ENCOUNTER — Other Ambulatory Visit: Payer: Medicaid Other | Admitting: Urology

## 2022-02-04 DIAGNOSIS — C679 Malignant neoplasm of bladder, unspecified: Secondary | ICD-10-CM

## 2022-10-02 DIAGNOSIS — R972 Elevated prostate specific antigen [PSA]: Secondary | ICD-10-CM | POA: Insufficient documentation

## 2022-10-02 DIAGNOSIS — C679 Malignant neoplasm of bladder, unspecified: Secondary | ICD-10-CM | POA: Insufficient documentation

## 2022-10-06 DIAGNOSIS — R042 Hemoptysis: Secondary | ICD-10-CM | POA: Diagnosis not present

## 2022-10-08 DIAGNOSIS — S2241XD Multiple fractures of ribs, right side, subsequent encounter for fracture with routine healing: Secondary | ICD-10-CM | POA: Diagnosis not present

## 2022-10-08 DIAGNOSIS — J449 Chronic obstructive pulmonary disease, unspecified: Secondary | ICD-10-CM | POA: Diagnosis not present

## 2022-10-08 DIAGNOSIS — R042 Hemoptysis: Secondary | ICD-10-CM | POA: Diagnosis not present

## 2022-10-08 DIAGNOSIS — I77810 Thoracic aortic ectasia: Secondary | ICD-10-CM | POA: Diagnosis not present

## 2022-10-08 DIAGNOSIS — I700 Atherosclerosis of aorta: Secondary | ICD-10-CM | POA: Diagnosis not present

## 2022-10-08 DIAGNOSIS — J439 Emphysema, unspecified: Secondary | ICD-10-CM | POA: Diagnosis not present

## 2022-10-08 DIAGNOSIS — Z8551 Personal history of malignant neoplasm of bladder: Secondary | ICD-10-CM | POA: Diagnosis not present

## 2022-10-08 DIAGNOSIS — R918 Other nonspecific abnormal finding of lung field: Secondary | ICD-10-CM | POA: Diagnosis not present

## 2022-10-08 DIAGNOSIS — K760 Fatty (change of) liver, not elsewhere classified: Secondary | ICD-10-CM | POA: Diagnosis not present

## 2022-10-08 DIAGNOSIS — J432 Centrilobular emphysema: Secondary | ICD-10-CM | POA: Diagnosis not present

## 2022-10-13 DIAGNOSIS — E782 Mixed hyperlipidemia: Secondary | ICD-10-CM | POA: Diagnosis not present

## 2022-10-13 DIAGNOSIS — I440 Atrioventricular block, first degree: Secondary | ICD-10-CM | POA: Diagnosis not present

## 2022-10-13 DIAGNOSIS — I2510 Atherosclerotic heart disease of native coronary artery without angina pectoris: Secondary | ICD-10-CM | POA: Diagnosis not present

## 2022-10-13 DIAGNOSIS — I10 Essential (primary) hypertension: Secondary | ICD-10-CM | POA: Diagnosis not present

## 2022-10-13 DIAGNOSIS — R0602 Shortness of breath: Secondary | ICD-10-CM | POA: Diagnosis not present

## 2022-10-13 DIAGNOSIS — I350 Nonrheumatic aortic (valve) stenosis: Secondary | ICD-10-CM | POA: Diagnosis not present

## 2022-10-13 DIAGNOSIS — Z8673 Personal history of transient ischemic attack (TIA), and cerebral infarction without residual deficits: Secondary | ICD-10-CM | POA: Diagnosis not present

## 2022-10-13 DIAGNOSIS — R42 Dizziness and giddiness: Secondary | ICD-10-CM | POA: Diagnosis not present

## 2022-10-13 DIAGNOSIS — R001 Bradycardia, unspecified: Secondary | ICD-10-CM | POA: Diagnosis not present

## 2022-10-13 DIAGNOSIS — J449 Chronic obstructive pulmonary disease, unspecified: Secondary | ICD-10-CM | POA: Diagnosis not present

## 2022-10-13 DIAGNOSIS — I77810 Thoracic aortic ectasia: Secondary | ICD-10-CM | POA: Diagnosis not present

## 2022-10-13 DIAGNOSIS — G4733 Obstructive sleep apnea (adult) (pediatric): Secondary | ICD-10-CM | POA: Diagnosis not present

## 2022-10-14 DIAGNOSIS — R911 Solitary pulmonary nodule: Secondary | ICD-10-CM | POA: Insufficient documentation

## 2022-10-27 DIAGNOSIS — I351 Nonrheumatic aortic (valve) insufficiency: Secondary | ICD-10-CM | POA: Diagnosis not present

## 2022-10-27 DIAGNOSIS — R42 Dizziness and giddiness: Secondary | ICD-10-CM | POA: Diagnosis not present

## 2022-10-27 DIAGNOSIS — I10 Essential (primary) hypertension: Secondary | ICD-10-CM | POA: Diagnosis not present

## 2022-10-27 DIAGNOSIS — I77810 Thoracic aortic ectasia: Secondary | ICD-10-CM | POA: Diagnosis not present

## 2022-10-27 DIAGNOSIS — I350 Nonrheumatic aortic (valve) stenosis: Secondary | ICD-10-CM | POA: Diagnosis not present

## 2022-10-30 DIAGNOSIS — I10 Essential (primary) hypertension: Secondary | ICD-10-CM | POA: Diagnosis not present

## 2022-10-30 DIAGNOSIS — L089 Local infection of the skin and subcutaneous tissue, unspecified: Secondary | ICD-10-CM | POA: Diagnosis not present

## 2022-10-30 DIAGNOSIS — R042 Hemoptysis: Secondary | ICD-10-CM | POA: Diagnosis not present

## 2022-10-30 DIAGNOSIS — K219 Gastro-esophageal reflux disease without esophagitis: Secondary | ICD-10-CM | POA: Diagnosis not present

## 2022-10-30 DIAGNOSIS — L720 Epidermal cyst: Secondary | ICD-10-CM | POA: Diagnosis not present

## 2022-10-30 DIAGNOSIS — L02212 Cutaneous abscess of back [any part, except buttock]: Secondary | ICD-10-CM | POA: Diagnosis not present

## 2022-10-30 DIAGNOSIS — R911 Solitary pulmonary nodule: Secondary | ICD-10-CM | POA: Diagnosis not present

## 2022-11-02 DIAGNOSIS — I491 Atrial premature depolarization: Secondary | ICD-10-CM | POA: Diagnosis not present

## 2022-11-02 DIAGNOSIS — I493 Ventricular premature depolarization: Secondary | ICD-10-CM | POA: Diagnosis not present

## 2022-11-02 DIAGNOSIS — R42 Dizziness and giddiness: Secondary | ICD-10-CM | POA: Diagnosis not present

## 2022-11-02 DIAGNOSIS — I440 Atrioventricular block, first degree: Secondary | ICD-10-CM | POA: Diagnosis not present

## 2022-11-02 DIAGNOSIS — I471 Supraventricular tachycardia, unspecified: Secondary | ICD-10-CM | POA: Diagnosis not present

## 2022-11-02 DIAGNOSIS — R001 Bradycardia, unspecified: Secondary | ICD-10-CM | POA: Diagnosis not present

## 2022-11-09 ENCOUNTER — Ambulatory Visit: Payer: 59 | Admitting: Urology

## 2022-11-09 DIAGNOSIS — J441 Chronic obstructive pulmonary disease with (acute) exacerbation: Secondary | ICD-10-CM | POA: Diagnosis not present

## 2022-11-12 DIAGNOSIS — L02212 Cutaneous abscess of back [any part, except buttock]: Secondary | ICD-10-CM | POA: Diagnosis not present

## 2022-11-16 DIAGNOSIS — L02212 Cutaneous abscess of back [any part, except buttock]: Secondary | ICD-10-CM | POA: Diagnosis not present

## 2022-11-19 DIAGNOSIS — L02212 Cutaneous abscess of back [any part, except buttock]: Secondary | ICD-10-CM | POA: Diagnosis not present

## 2022-11-19 DIAGNOSIS — I10 Essential (primary) hypertension: Secondary | ICD-10-CM | POA: Diagnosis not present

## 2022-11-23 DIAGNOSIS — L02212 Cutaneous abscess of back [any part, except buttock]: Secondary | ICD-10-CM | POA: Diagnosis not present

## 2022-11-26 DIAGNOSIS — L989 Disorder of the skin and subcutaneous tissue, unspecified: Secondary | ICD-10-CM | POA: Diagnosis not present

## 2022-11-26 DIAGNOSIS — J449 Chronic obstructive pulmonary disease, unspecified: Secondary | ICD-10-CM | POA: Diagnosis not present

## 2022-11-26 DIAGNOSIS — L723 Sebaceous cyst: Secondary | ICD-10-CM | POA: Diagnosis not present

## 2022-12-02 DIAGNOSIS — L723 Sebaceous cyst: Secondary | ICD-10-CM | POA: Diagnosis not present

## 2022-12-09 DIAGNOSIS — I77810 Thoracic aortic ectasia: Secondary | ICD-10-CM | POA: Insufficient documentation

## 2022-12-09 DIAGNOSIS — G4733 Obstructive sleep apnea (adult) (pediatric): Secondary | ICD-10-CM | POA: Diagnosis not present

## 2022-12-09 DIAGNOSIS — F17200 Nicotine dependence, unspecified, uncomplicated: Secondary | ICD-10-CM | POA: Diagnosis not present

## 2022-12-09 DIAGNOSIS — R42 Dizziness and giddiness: Secondary | ICD-10-CM | POA: Diagnosis not present

## 2022-12-09 DIAGNOSIS — I350 Nonrheumatic aortic (valve) stenosis: Secondary | ICD-10-CM | POA: Diagnosis not present

## 2022-12-09 DIAGNOSIS — I700 Atherosclerosis of aorta: Secondary | ICD-10-CM | POA: Diagnosis not present

## 2022-12-09 DIAGNOSIS — I10 Essential (primary) hypertension: Secondary | ICD-10-CM | POA: Diagnosis not present

## 2022-12-09 DIAGNOSIS — J449 Chronic obstructive pulmonary disease, unspecified: Secondary | ICD-10-CM | POA: Diagnosis not present

## 2022-12-09 DIAGNOSIS — Z8673 Personal history of transient ischemic attack (TIA), and cerebral infarction without residual deficits: Secondary | ICD-10-CM | POA: Diagnosis not present

## 2022-12-09 DIAGNOSIS — N183 Chronic kidney disease, stage 3 unspecified: Secondary | ICD-10-CM | POA: Diagnosis not present
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
## 2022-12-14 ENCOUNTER — Encounter: Payer: Self-pay | Admitting: Internal Medicine

## 2022-12-14 ENCOUNTER — Ambulatory Visit: Payer: 59 | Admitting: Internal Medicine

## 2022-12-14 DIAGNOSIS — M51369 Other intervertebral disc degeneration, lumbar region without mention of lumbar back pain or lower extremity pain: Secondary | ICD-10-CM | POA: Insufficient documentation

## 2022-12-14 DIAGNOSIS — N183 Chronic kidney disease, stage 3 unspecified: Secondary | ICD-10-CM | POA: Insufficient documentation

## 2022-12-14 DIAGNOSIS — J329 Chronic sinusitis, unspecified: Secondary | ICD-10-CM | POA: Insufficient documentation

## 2022-12-14 DIAGNOSIS — J309 Allergic rhinitis, unspecified: Secondary | ICD-10-CM | POA: Insufficient documentation

## 2022-12-14 DIAGNOSIS — G4733 Obstructive sleep apnea (adult) (pediatric): Secondary | ICD-10-CM | POA: Insufficient documentation

## 2022-12-17 ENCOUNTER — Ambulatory Visit: Payer: 59 | Admitting: Urology

## 2022-12-17 ENCOUNTER — Other Ambulatory Visit: Payer: Self-pay

## 2022-12-17 ENCOUNTER — Encounter: Payer: Self-pay | Admitting: Urology

## 2022-12-17 VITALS — BP 150/88 | HR 60

## 2022-12-17 DIAGNOSIS — Z8551 Personal history of malignant neoplasm of bladder: Secondary | ICD-10-CM | POA: Diagnosis not present

## 2022-12-17 DIAGNOSIS — N403 Nodular prostate with lower urinary tract symptoms: Secondary | ICD-10-CM

## 2022-12-17 DIAGNOSIS — R972 Elevated prostate specific antigen [PSA]: Secondary | ICD-10-CM

## 2022-12-17 DIAGNOSIS — R3915 Urgency of urination: Secondary | ICD-10-CM

## 2022-12-17 DIAGNOSIS — N5201 Erectile dysfunction due to arterial insufficiency: Secondary | ICD-10-CM

## 2022-12-17 DIAGNOSIS — C679 Malignant neoplasm of bladder, unspecified: Secondary | ICD-10-CM

## 2022-12-17 LAB — MICROSCOPIC EXAMINATION: Bacteria, UA: NONE SEEN

## 2022-12-17 LAB — URINALYSIS, ROUTINE W REFLEX MICROSCOPIC
Bilirubin, UA: NEGATIVE
Glucose, UA: NEGATIVE
Ketones, UA: NEGATIVE
Leukocytes,UA: NEGATIVE
Nitrite, UA: NEGATIVE
RBC, UA: NEGATIVE
Specific Gravity, UA: 1.02 (ref 1.005–1.030)
Urobilinogen, Ur: 1 mg/dL (ref 0.2–1.0)
pH, UA: 6.5 (ref 5.0–7.5)

## 2022-12-17 MED ORDER — SILDENAFIL CITRATE 100 MG PO TABS: ORAL_TABLET | ORAL | 0 refills | Status: DC
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
## 2022-12-17 NOTE — Patient Instructions (Signed)
You have an elevated PSA of 17.8  with normal < 4.  There is a nodule on the prostate on exam.  Both of these are suspicious for the presence of a prostate cancer.  We need to do a prostate biopsy.  I have included information on the biopsy and prostate cancer.  You have a history of bladder cancer and need cystoscopy to look in the bladder and also to put dye in the kidneys to assess for tumors in that area.    The risk of these procedures are bleeding, infection and difficulty urinating.

## 2023-01-06 DIAGNOSIS — K219 Gastro-esophageal reflux disease without esophagitis: Secondary | ICD-10-CM | POA: Insufficient documentation

## 2023-01-06 DIAGNOSIS — Z9181 History of falling: Secondary | ICD-10-CM | POA: Insufficient documentation

## 2023-01-06 DIAGNOSIS — I440 Atrioventricular block, first degree: Secondary | ICD-10-CM | POA: Insufficient documentation

## 2023-01-06 DIAGNOSIS — N401 Enlarged prostate with lower urinary tract symptoms: Secondary | ICD-10-CM | POA: Insufficient documentation

## 2023-01-06 DIAGNOSIS — F419 Anxiety disorder, unspecified: Secondary | ICD-10-CM | POA: Insufficient documentation

## 2023-01-06 DIAGNOSIS — I4891 Unspecified atrial fibrillation: Secondary | ICD-10-CM | POA: Insufficient documentation

## 2023-01-06 DIAGNOSIS — Z8551 Personal history of malignant neoplasm of bladder: Secondary | ICD-10-CM | POA: Insufficient documentation

## 2023-01-06 DIAGNOSIS — I739 Peripheral vascular disease, unspecified: Secondary | ICD-10-CM | POA: Insufficient documentation

## 2023-01-06 DIAGNOSIS — I2510 Atherosclerotic heart disease of native coronary artery without angina pectoris: Secondary | ICD-10-CM | POA: Insufficient documentation
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
## 2023-01-19 ENCOUNTER — Ambulatory Visit: Payer: 59 | Admitting: Internal Medicine

## 2023-01-19 ENCOUNTER — Encounter: Payer: Self-pay | Admitting: Internal Medicine

## 2023-01-19 VITALS — BP 135/78 | HR 59 | Ht 72.0 in | Wt 193.0 lb

## 2023-01-19 DIAGNOSIS — J449 Chronic obstructive pulmonary disease, unspecified: Secondary | ICD-10-CM | POA: Insufficient documentation

## 2023-01-19 DIAGNOSIS — F17210 Nicotine dependence, cigarettes, uncomplicated: Secondary | ICD-10-CM

## 2023-01-19 DIAGNOSIS — I10 Essential (primary) hypertension: Secondary | ICD-10-CM

## 2023-01-19 DIAGNOSIS — R911 Solitary pulmonary nodule: Secondary | ICD-10-CM | POA: Diagnosis not present

## 2023-01-19 MED ORDER — SPIRIVA RESPIMAT 2.5 MCG/ACT IN AERS: 2.0000 | INHALATION_SPRAY | Freq: Every day | RESPIRATORY_TRACT | 11 refills | Status: DC

## 2023-01-19 MED ORDER — OLMESARTAN MEDOXOMIL 20 MG PO TABS: 20.0000 mg | ORAL_TABLET | Freq: Every day | ORAL | 11 refills | Status: DC

## 2023-01-19 NOTE — Patient Instructions (Addendum)
 We will walk you today for a baseline   Stop lisinopril and the pill spiriva    Start olmesartan   20 mg one daily in place of lisinopril   Start Spiriva   2 puffs each am  - take one if you have trouble peeing   If getting light headed standing up, stop the amlodipine  since flomax  should not be stopped    Please schedule a follow up office visit in 6 weeks, call sooner if needed with PFTs when available

## 2023-01-19 NOTE — Assessment & Plan Note (Addendum)
 Left upper lobe nodule 4 mm, likely inflammatory, noted on CT chest 09/2022. Needs follow up CT chest in 03/2023.  >>>  Discussed CT findings with pt > placed in reminder file for 03/06/23 just in case not followed up as planned  Discussed in detail all the  indications, usual  risks and alternatives  relative to the benefits with patient who agrees to proceed with w/u as outlined.     Each maintenance medication was reviewed in detail including emphasizing most importantly the difference between maintenance and prns and under what circumstances the prns are to be triggered using an action plan format where appropriate.  Total time for H and P, chart review, counseling, reviewing hfa/smi device(s) , directly observing portions of ambulatory 02 saturation study/ and generating customized AVS unique to this office visit / same day charting  > 45 min new pt eval

## 2023-01-19 NOTE — Assessment & Plan Note (Signed)

## 2023-01-19 NOTE — Assessment & Plan Note (Signed)
 Change lisinopril to benicar  01/19/2023 due to severe cough / atypical copd symptoms  ACE inhibitors are problematic in  pts with airway complaints because  even experienced pulmonologists can't always distinguish ace effects from copd/asthma.  By themselves they don't actually cause a problem, much like oxygen can't by itself start a fire, but they certainly serve as a powerful catalyst or enhancer for any fire  or inflammatory process in the upper airway, be it caused by an ET  tube or more commonly reflux (especially in the obese or pts with known GERD or who are on biphoshonates).    In the era of ARB near equivalency until we have a better handle on the reversibility of the airway problem, it just makes sense to avoid ACEI  entirely in the short run  pending f/u here with pfts  >>> try olmesatan 20 mg daily in place of lisinopril   >>> if having trouble with orthostasis from combination of flomax  and amlodipine , consider holding the amlodipine  since already having trouble with passing urine. If pfts look good off acei though may consider stopping spiriva  next ov   >>> recheck in 6 weeks

## 2023-01-19 NOTE — Assessment & Plan Note (Addendum)
 Active smoker  - 01/19/2023  After extensive coaching inhaler device,  effectiveness =    90% with SMI > change to spiriva  smi 2 q am if urine flow tolerates  -  01/19/2023   Walked on RA  x  3  lap(s) =  approx 450  ft  @ mod pace, stopped due to end of study  with lowest 02 sats 98% and no sob /cp      DDX of  difficult airways management almost all start with A and  include Adherence, Ace Inhibitors, Acid Reflux, Active Sinus Disease, Alpha 1 Antitripsin deficiency, Anxiety masquerading as Airways dz,  ABPA,  Allergy(esp in young), Aspiration (esp in elderly), Adverse effects of meds,  Active smoking or vaping, A bunch of PE's (a small clot burden can't cause this syndrome unless there is already severe underlying pulm or vascular dz with poor reserve) plus two Bs  = Bronchiectasis and Beta blocker use..and one C= CHF   Adherence is always the initial prime suspect and is a multilayered concern that requires a trust but verify approach in every patient - starting with knowing how to use medications, especially inhalers, correctly, keeping up with refills and understanding the fundamental difference between maintenance and prns vs those medications only taken for a very short course and then stopped and not refilled.  - see smi teaching   Cigarette smoking (see sep a/p)  ACEi adverse effects aalso at the  top of the usual list of suspects and the only way to rule it out is a trial off > see a/p    ?adverse effects of DPI > change to [facility] spiriva  which gets more drug to target at a much lower dose of drug so less prostate side effects   ? CHF > cards w/u reviewed, nothing to suggest AP or cardiac asthma

## 2023-01-21 ENCOUNTER — Other Ambulatory Visit: Payer: Self-pay

## 2023-01-22 ENCOUNTER — Encounter (HOSPITAL_COMMUNITY)
Admission: RE | Admit: 2023-01-22 | Discharge: 2023-01-22 | Disposition: A | Discharge status: Home / Self Care | Payer: 59 | Source: Ambulatory Visit | Attending: Urology | Admitting: Urology

## 2023-01-22 ENCOUNTER — Encounter (HOSPITAL_COMMUNITY): Payer: Self-pay

## 2023-01-22 ENCOUNTER — Telehealth: Payer: Self-pay | Admitting: Urology

## 2023-01-22 VITALS — BP 135/78 | HR 59 | Temp 97.8°F | Resp 18 | Ht 72.0 in | Wt 193.0 lb

## 2023-01-22 DIAGNOSIS — T502X5A Adverse effect of carbonic-anhydrase inhibitors, benzothiadiazides and other diuretics, initial encounter: Secondary | ICD-10-CM | POA: Diagnosis not present

## 2023-01-22 DIAGNOSIS — Z01818 Encounter for other preprocedural examination: Secondary | ICD-10-CM | POA: Insufficient documentation

## 2023-01-22 DIAGNOSIS — I10 Essential (primary) hypertension: Secondary | ICD-10-CM | POA: Diagnosis not present

## 2023-01-22 HISTORY — DX: Pneumonia, unspecified organism: J18.9

## 2023-01-22 HISTORY — DX: Dyspnea, unspecified: R06.00

## 2023-01-22 HISTORY — DX: Cardiac arrhythmia, unspecified: I49.9

## 2023-01-22 LAB — BASIC METABOLIC PANEL
Anion gap: 6 (ref 5–15)
BUN: 12 mg/dL (ref 8–23)
CO2: 23 mmol/L (ref 22–32)
Calcium: 8.8 mg/dL — ABNORMAL LOW (ref 8.9–10.3)
Chloride: 105 mmol/L (ref 98–111)
Creatinine, Ser: 0.94 mg/dL (ref 0.61–1.24)
GFR, Estimated: >60 mL/min (ref >60)
Glucose, Bld: 92 mg/dL (ref 70–99)
Potassium: 3.6 mmol/L (ref 3.5–5.1)
Sodium: 134 mmol/L — ABNORMAL LOW (ref 135–145)

## 2023-01-22 NOTE — Telephone Encounter (Signed)
Return call to patient.Patient hasn't had medication in about three years and there is nothing document for patient to continued Flomax. Patient is aware a message will be sent to the provider and someone will reach back out with provider recommendation. Patient also states he has a hard time remembering.

## 2023-01-22 NOTE — Patient Instructions (Signed)
Mark Waller  01/22/2023     @PREFPERIOPPHARMACY @   Your procedure is scheduled on 01/25/2023.   Report to [facility] at 11:00 A.M.   Call this number if you have problems the morning of surgery:  [phone]  If you experience any cold or flu symptoms such as cough, fever, chills, shortness of breath, etc. between now and your scheduled surgery, please notify us at the above number.   Remember:   Do not eat  after midnight.   You may drink clear liquids until 9AM .     Clear liquids allowed are:                    Water, Juice (No red color; non-citric and without pulp; diabetics please choose diet or no sugar options), Carbonated beverages (diabetics please choose diet or no sugar options), and Clear Sports drink (No red color; diabetics please choose diet or no sugar options)    Take these medicines the morning of surgery with A SIP OF WATER :Norvasc Flonase Prilosec and Flomax.     Please use your albuterol inhaler before coming to the hospital and bring it with you.     Do not wear jewelry, make-up or nail polish, including gel polish,  artificial nails, or any other type of covering on natural nails (fingers and  toes).  Do not wear lotions, powders, or perfumes, or deodorant.  Do not shave 48 hours prior to surgery.  Men may shave face and neck.  Do not bring valuables to the hospital.  [facility] is not responsible for any belongings or valuables.  Contacts, dentures or bridgework may not be worn into surgery.  Leave your suitcase in the car.  After surgery it may be brought to your room.  For patients admitted to the hospital, discharge time will be determined by your treatment team.  Patients discharged the day of surgery will not be allowed to drive home.   Name and phone number of your driver:   Family Special instructions:  NA  Please read over the following fact sheets that you were given.  Care and Recovery After Surgery  Cystoscopy A cystoscopy  may be done to find or treat a condition in your lower urinary tract. Your lower urinary tract includes your bladder and urethra. The urethra is the part of your body that drains pee (urine) from your bladder. You may need this procedure if: You have: Urinary tract infections (UTIs) that keep coming back. Blood in your pee. Pain when you pee. A blockage in your urethra, such as a urinary stone. You can't control when you pee, or you have to pee a lot. There are cells that aren't normal in your pee sample. A problem is found in your bladder during a test. You need a biopsy. This is when a small piece of tissue is removed for testing. Tell a health care provider about: Any allergies you have. All medicines you take. These include vitamins, herbs, eye drops, and creams. Any problems you or family members have had with anesthesia. Any bleeding problems you have. Any surgeries you've had. Any medical conditions you have. Whether you're pregnant or may be pregnant. What are the risks? Your health care provider will talk with you about risks. These may include: Infection. Bleeding. Allergic reactions to medicines. Damage to your urethra or bladder. What happens before the procedure? Medicines Ask about changing or stopping: Any medicines you take. Any vitamins, herbs, or supplements  you take. Do not take aspirin or ibuprofen unless you're told to. Tests You may have an exam or tests. These may include: Pee tests to check for signs of infection. X-rays of: Your bladder. Your urethra. Your kidneys. A CT scan of your belly or hips. General instructions Eat and drink only as you've been told. For your safety, you may: Need to wash your skin with a soap that kills germs. Get antibiotics. Have hair removed at the procedure site. If you'll be going home right after the procedure, plan to have a responsible adult: Take you home from the hospital or clinic. You won't be allowed to  drive. Stay with you for the time you're told. What happens during the procedure?  You may be given: A sedative to help you relax. Anesthesia to keep you from feeling pain. The opening of your urethra will be cleaned. A thin tube called a cystoscope will be put into your urethra. The tube has a light and camera on the end of it. The tube will be passed into your bladder. A germ-free (sterile) fluid will flow through the tube. The fluid will stretch your bladder. This helps your provider see the walls of your bladder more clearly. A biopsy may be taken. Stones may be removed. The tube will be taken out. Your bladder will be emptied. The procedure may vary among providers and hospitals. What can I expect after the procedure? It's common to have: Some soreness or pain in your belly and urethra. Mild pain or burning when you pee. The pain should stop a few minutes after you pee. This may last for up to a week. A small amount of blood in your pee for a few days. A feeling like you need to pee often. But when you do, you may only pee a little. Follow these instructions at home: Medicines Take your medicines only as told. If you were given antibiotics, take them as told. Do not stop taking them even if you start to feel better. General instructions If you were given a sedative, do not drive or use machines until you're told it's safe. A sedative can make you sleepy. Eat and drink as told. If a biopsy was taken, ask when your test results will be ready and how to get them. You may need to call or meet with your provider to get your results. Ask what things are safe for you to do at home. Ask when you can go back to work or school. Contact a health care provider if: Your pain gets worse. Your pain doesn't get better with medicine. You have trouble peeing. You have more blood in your pee. You have a fever or chills. Get help right away if: You have blood clots in your pee. You can't  pee. This information is not intended to replace advice given to you by your health care provider. Make sure you discuss any questions you have with your health care provider. Document Revised: 04/28/2022 Document Reviewed: 04/28/2022 Elsevier Patient Education  2024 [employer]  General Anesthesia, Adult General anesthesia is the use of medicine to make you fall asleep (unconscious) for a medical procedure. General anesthesia must be used for certain procedures. It is often recommended for surgery or procedures that: Last a long time. Require you to be still or in an unusual position. Are major and can cause blood loss. Affect your breathing. The medicines used for general anesthesia are called general anesthetics. During general anesthesia, these medicines are given  along with medicines that: Prevent pain. Control your blood pressure. Relax your muscles. Prevent nausea and vomiting after the procedure. Tell a health care provider about: Any allergies you have. All medicines you are taking, including vitamins, herbs, eye drops, creams, and over-the-counter medicines. Your history of any: Medical conditions you have, including: High blood pressure. Bleeding problems. Diabetes. Heart or lung conditions, such as: Heart failure. Sleep apnea. Asthma. Chronic obstructive pulmonary disease (COPD). Current or recent illnesses, such as: Upper respiratory, chest, or ear infections. Cough or fever. Tobacco or drug use, including marijuana or alcohol use. Depression or anxiety. Surgeries and types of anesthetics you have had. Problems you or family members have had with anesthetic medicines. Whether you are pregnant or may be pregnant. Whether you have any chipped or loose teeth, dentures, caps, bridgework, or issues with your mouth, swallowing, or choking. What are the risks? Your health care provider will talk with you about risks. These may include: Allergic reaction to the  medicines. Lung and heart problems. Inhaling food or liquid from the stomach into the lungs (aspiration). Nerve injury. Injury to the lips, mouth, teeth, or gums. Stroke. Waking up during your procedure and being unable to move. This is rare. These problems are more likely to develop if you are having a major surgery or if you have an advanced or serious medical condition. You can prevent some of these complications by answering all of your health care provider's questions thoroughly and by following all instructions before your procedure. General anesthesia can cause side effects, including: Nausea or vomiting. A sore throat or hoarseness from the breathing tube. Wheezing or coughing. Shaking chills or feeling cold. Body aches. Sleepiness. Confusion, agitation (delirium), or anxiety. What happens before the procedure? When to stop eating and drinking Follow instructions from your health care provider about what you may eat and drink before your procedure. If you do not follow your health care provider's instructions, your procedure may be delayed or canceled. Medicines Ask your health care provider about: Changing or stopping your regular medicines. These include any diabetes medicines or blood thinners you take. Taking medicines such as aspirin and ibuprofen. These medicines can thin your blood. Do not take them unless your health care provider tells you to. Taking over-the-counter medicines, vitamins, herbs, and supplements. General instructions Do not use any products that contain nicotine or tobacco for at least 4 weeks before the procedure. These products include cigarettes, chewing tobacco, and vaping devices, such as e-cigarettes. If you need help quitting, ask your health care provider. If you brush your teeth on the morning of the procedure, make sure to spit out all of the water and toothpaste. If told by your health care provider, bring your sleep apnea device with you to  surgery (if applicable). If you will be going home right after the procedure, plan to have a responsible adult: Take you home from the hospital or clinic. You will not be allowed to drive. Care for you for the time you are told. What happens during the procedure?  An IV will be inserted into one of your veins. You will be given one or more of the following through a face mask or IV: A sedative. This helps you relax. Anesthesia. This will: Numb certain areas of your body. Make you fall asleep for surgery. After you are unconscious, a breathing tube may be inserted down your throat to help you breathe. This will be removed before you wake up. An anesthesia provider, such as an  anesthesiologist, will stay with you throughout your procedure. The anesthesia provider will: Keep you comfortable and safe by continuing to give you medicines and adjusting the amount of medicine that you get. Monitor your blood pressure, heart rate, and oxygen levels to make sure that the anesthetics do not cause any problems. The procedure may vary among health care providers and hospitals. What happens after the procedure? Your blood pressure, temperature, heart rate, breathing rate, and blood oxygen level will be monitored until you leave the hospital or clinic. You will wake up in a recovery area. You may wake up slowly. You may be given medicine to help you with pain, nausea, or any other side effects from the anesthesia. Summary General anesthesia is the use of medicine to make you fall asleep (unconscious) for a medical procedure. Follow your health care provider's instructions about when to stop eating, drinking, or taking certain medicines before your procedure. Plan to have a responsible adult take you home from the hospital or clinic. This information is not intended to replace advice given to you by your health care provider. Make sure you discuss any questions you have with your health care  provider. Document Revised: 03/20/2021 Document Reviewed: 03/20/2021 Elsevier Patient Education  2024 [employer]  How to Use Chlorhexidine Before Surgery Chlorhexidine gluconate (CHG) is a germ-killing (antiseptic) solution that is used to clean the skin. It can get rid of the bacteria that normally live on the skin and can keep them away for about 24 hours. To clean your skin with CHG, you may be given: A CHG solution to use in the shower or as part of a sponge bath. A prepackaged cloth that contains CHG. Cleaning your skin with CHG may help lower the risk for infection: While you are staying in the intensive care unit of the hospital. If you have a vascular access, such as a central line, to provide short-term or long-term access to your veins. If you have a catheter to drain urine from your bladder. If you are on a ventilator. A ventilator is a machine that helps you breathe by moving air in and out of your lungs. After surgery. What are the risks? Risks of using CHG include: A skin reaction. Hearing loss, if CHG gets in your ears and you have a perforated eardrum. Eye injury, if CHG gets in your eyes and is not rinsed out. The CHG product catching fire. Make sure that you avoid smoking and flames after applying CHG to your skin. Do not use CHG: If you have a chlorhexidine allergy or have previously reacted to chlorhexidine. On babies younger than 44 months of age. How to use CHG solution Use CHG only as told by your health care provider, and follow the instructions on the label. Use the full amount of CHG as directed. Usually, this is one bottle. During a shower Follow these steps when using CHG solution during a shower (unless your health care provider gives you different instructions): Start the shower. Use your normal soap and shampoo to wash your face and hair. Turn off the shower or move out of the shower stream. Pour the CHG onto a clean washcloth. Do not use any type of  brush or rough-edged sponge. Starting at your neck, lather your body down to your toes. Make sure you follow these instructions: If you will be having surgery, pay special attention to the part of your body where you will be having surgery. Scrub this area for at least 1 minute.  Do not use CHG on your head or face. If the solution gets into your ears or eyes, rinse them well with water. Avoid your genital area. Avoid any areas of skin that have broken skin, cuts, or scrapes. Scrub your back and under your arms. Make sure to wash skin folds. Let the lather sit on your skin for 1-2 minutes or as long as told by your health care provider. Thoroughly rinse your entire body in the shower. Make sure that all body creases and crevices are rinsed well. Dry off with a clean towel. Do not put any substances on your body afterward--such as powder, lotion, or perfume--unless you are told to do so by your health care provider. Only use lotions that are recommended by the manufacturer. Put on clean clothes or pajamas. If it is the night before your surgery, sleep in clean sheets.  During a sponge bath Follow these steps when using CHG solution during a sponge bath (unless your health care provider gives you different instructions): Use your normal soap and shampoo to wash your face and hair. Pour the CHG onto a clean washcloth. Starting at your neck, lather your body down to your toes. Make sure you follow these instructions: If you will be having surgery, pay special attention to the part of your body where you will be having surgery. Scrub this area for at least 1 minute. Do not use CHG on your head or face. If the solution gets into your ears or eyes, rinse them well with water. Avoid your genital area. Avoid any areas of skin that have broken skin, cuts, or scrapes. Scrub your back and under your arms. Make sure to wash skin folds. Let the lather sit on your skin for 1-2 minutes or as long as told by  your health care provider. Using a different clean, wet washcloth, thoroughly rinse your entire body. Make sure that all body creases and crevices are rinsed well. Dry off with a clean towel. Do not put any substances on your body afterward--such as powder, lotion, or perfume--unless you are told to do so by your health care provider. Only use lotions that are recommended by the manufacturer. Put on clean clothes or pajamas. If it is the night before your surgery, sleep in clean sheets. How to use CHG prepackaged cloths Only use CHG cloths as told by your health care provider, and follow the instructions on the label. Use the CHG cloth on clean, dry skin. Do not use the CHG cloth on your head or face unless your health care provider tells you to. When washing with the CHG cloth: Avoid your genital area. Avoid any areas of skin that have broken skin, cuts, or scrapes. Before surgery Follow these steps when using a CHG cloth to clean before surgery (unless your health care provider gives you different instructions): Using the CHG cloth, vigorously scrub the part of your body where you will be having surgery. Scrub using a back-and-forth motion for 3 minutes. The area on your body should be completely wet with CHG when you are done scrubbing. Do not rinse. Discard the cloth and let the area air-dry. Do not put any substances on the area afterward, such as powder, lotion, or perfume. Put on clean clothes or pajamas. If it is the night before your surgery, sleep in clean sheets.  For general bathing Follow these steps when using CHG cloths for general bathing (unless your health care provider gives you different instructions). Use a separate CHG  cloth for each area of your body. Make sure you wash between any folds of skin and between your fingers and toes. Wash your body in the following order, switching to a new cloth after each step: The front of your neck, shoulders, and chest. Both of your  arms, under your arms, and your hands. Your stomach and groin area, avoiding the genitals. Your right leg and foot. Your left leg and foot. The back of your neck, your back, and your buttocks. Do not rinse. Discard the cloth and let the area air-dry. Do not put any substances on your body afterward--such as powder, lotion, or perfume--unless you are told to do so by your health care provider. Only use lotions that are recommended by the manufacturer. Put on clean clothes or pajamas. Contact a health care provider if: Your skin gets irritated after scrubbing. You have questions about using your solution or cloth. You swallow any chlorhexidine. Call your local poison control center (912-627-0745 in the [place] Get help right away if: Your eyes itch badly, or they become very red or swollen. Your skin itches badly and is red or swollen. Your hearing changes. You have trouble seeing. You have swelling or tingling in your mouth or throat. You have trouble breathing. These symptoms may represent a serious problem that is an emergency. Do not wait to see if the symptoms will go away. Get medical help right away. Call your local emergency services (911 in the [place] Do not drive yourself to the hospital. Summary Chlorhexidine gluconate (CHG) is a germ-killing (antiseptic) solution that is used to clean the skin. Cleaning your skin with CHG may help to lower your risk for infection. You may be given CHG to use for bathing. It may be in a bottle or in a prepackaged cloth to use on your skin. Carefully follow your health care provider's instructions and the instructions on the product label. Do not use CHG if you have a chlorhexidine allergy. Contact your health care provider if your skin gets irritated after scrubbing. This information is not intended to replace advice given to you by your health care provider. Make sure you discuss any questions you have with your health care provider. Document  Revised: 04/21/2021 Document Reviewed: 03/04/2020 Elsevier Patient Education  2023 [employer].

## 2023-01-22 NOTE — Telephone Encounter (Signed)
States he needs Flomax RX

## 2023-01-25 ENCOUNTER — Ambulatory Visit (HOSPITAL_COMMUNITY): Admission: RE | Admit: 2023-01-25 | Payer: 59 | Source: Home / Self Care | Admitting: Urology

## 2023-01-25 ENCOUNTER — Encounter (HOSPITAL_COMMUNITY): Admission: RE | Payer: Self-pay | Source: Home / Self Care

## 2023-01-25 SURGERY — CYSTOSCOPY, WITH RETROGRADE PYELOGRAM
Anesthesia: General

## 2023-01-26 NOTE — Telephone Encounter (Signed)
Patient is requesting a refill for Flomax

## 2023-01-28 ENCOUNTER — Other Ambulatory Visit: Payer: Self-pay

## 2023-01-28 DIAGNOSIS — R3915 Urgency of urination: Secondary | ICD-10-CM

## 2023-01-28 MED ORDER — TAMSULOSIN HCL 0.4 MG PO CAPS: 0.4000 mg | ORAL_CAPSULE | Freq: Every day | ORAL | 3 refills | Status: AC

## 2023-01-28 NOTE — Telephone Encounter (Signed)
Patient is made aware Rx sent to pharmacy. Per Dr. Annabell Howells it's okay to refill Flomax.

## 2023-02-02 ENCOUNTER — Other Ambulatory Visit: Payer: Self-pay

## 2023-02-02 ENCOUNTER — Inpatient Hospital Stay (HOSPITAL_COMMUNITY)
Admission: EM | Admit: 2023-02-02 | Discharge: 2023-02-05 | DRG: 682 | Disposition: A | Discharge status: Home Health | Payer: 59 | Attending: Family Medicine | Admitting: Family Medicine

## 2023-02-02 ENCOUNTER — Encounter (HOSPITAL_COMMUNITY): Payer: Self-pay | Admitting: *Deleted

## 2023-02-02 ENCOUNTER — Emergency Department (HOSPITAL_COMMUNITY): Payer: 59

## 2023-02-02 DIAGNOSIS — N403 Nodular prostate with lower urinary tract symptoms: Secondary | ICD-10-CM | POA: Insufficient documentation

## 2023-02-02 DIAGNOSIS — Z8249 Family history of ischemic heart disease and other diseases of the circulatory system: Secondary | ICD-10-CM | POA: Diagnosis not present

## 2023-02-02 DIAGNOSIS — I951 Orthostatic hypotension: Secondary | ICD-10-CM | POA: Diagnosis present

## 2023-02-02 DIAGNOSIS — E782 Mixed hyperlipidemia: Secondary | ICD-10-CM | POA: Diagnosis present

## 2023-02-02 DIAGNOSIS — K219 Gastro-esophageal reflux disease without esophagitis: Secondary | ICD-10-CM | POA: Diagnosis present

## 2023-02-02 DIAGNOSIS — I2510 Atherosclerotic heart disease of native coronary artery without angina pectoris: Secondary | ICD-10-CM | POA: Diagnosis present

## 2023-02-02 DIAGNOSIS — J9811 Atelectasis: Secondary | ICD-10-CM | POA: Diagnosis not present

## 2023-02-02 DIAGNOSIS — Z881 Allergy status to other antibiotic agents status: Secondary | ICD-10-CM

## 2023-02-02 DIAGNOSIS — J440 Chronic obstructive pulmonary disease with acute lower respiratory infection: Secondary | ICD-10-CM | POA: Diagnosis present

## 2023-02-02 DIAGNOSIS — K7460 Unspecified cirrhosis of liver: Secondary | ICD-10-CM | POA: Diagnosis present

## 2023-02-02 DIAGNOSIS — Z79899 Other long term (current) drug therapy: Secondary | ICD-10-CM | POA: Diagnosis not present

## 2023-02-02 DIAGNOSIS — R55 Syncope and collapse: Principal | ICD-10-CM | POA: Diagnosis present

## 2023-02-02 DIAGNOSIS — I440 Atrioventricular block, first degree: Secondary | ICD-10-CM | POA: Diagnosis not present

## 2023-02-02 DIAGNOSIS — Z8551 Personal history of malignant neoplasm of bladder: Secondary | ICD-10-CM

## 2023-02-02 DIAGNOSIS — Z8673 Personal history of transient ischemic attack (TIA), and cerebral infarction without residual deficits: Secondary | ICD-10-CM | POA: Diagnosis not present

## 2023-02-02 DIAGNOSIS — F17210 Nicotine dependence, cigarettes, uncomplicated: Secondary | ICD-10-CM | POA: Diagnosis present

## 2023-02-02 DIAGNOSIS — Z860100 Personal history of colon polyps, unspecified: Secondary | ICD-10-CM

## 2023-02-02 DIAGNOSIS — E871 Hypo-osmolality and hyponatremia: Secondary | ICD-10-CM | POA: Diagnosis not present

## 2023-02-02 DIAGNOSIS — N400 Enlarged prostate without lower urinary tract symptoms: Secondary | ICD-10-CM | POA: Diagnosis present

## 2023-02-02 DIAGNOSIS — I350 Nonrheumatic aortic (valve) stenosis: Secondary | ICD-10-CM | POA: Diagnosis present

## 2023-02-02 DIAGNOSIS — R918 Other nonspecific abnormal finding of lung field: Secondary | ICD-10-CM | POA: Diagnosis not present

## 2023-02-02 DIAGNOSIS — I10 Essential (primary) hypertension: Secondary | ICD-10-CM | POA: Diagnosis present

## 2023-02-02 DIAGNOSIS — N179 Acute kidney failure, unspecified: Principal | ICD-10-CM | POA: Insufficient documentation

## 2023-02-02 DIAGNOSIS — Z886 Allergy status to analgesic agent status: Secondary | ICD-10-CM | POA: Diagnosis not present

## 2023-02-02 DIAGNOSIS — J449 Chronic obstructive pulmonary disease, unspecified: Secondary | ICD-10-CM | POA: Diagnosis present

## 2023-02-02 DIAGNOSIS — J189 Pneumonia, unspecified organism: Secondary | ICD-10-CM | POA: Diagnosis not present

## 2023-02-02 DIAGNOSIS — R0602 Shortness of breath: Secondary | ICD-10-CM | POA: Diagnosis not present

## 2023-02-02 DIAGNOSIS — Z885 Allergy status to narcotic agent status: Secondary | ICD-10-CM | POA: Diagnosis not present

## 2023-02-02 DIAGNOSIS — Z880 Allergy status to penicillin: Secondary | ICD-10-CM | POA: Diagnosis not present

## 2023-02-02 DIAGNOSIS — S2232XA Fracture of one rib, left side, initial encounter for closed fracture: Secondary | ICD-10-CM | POA: Diagnosis not present

## 2023-02-02 LAB — CBC
HCT: 44 % (ref 39.0–52.0)
Hemoglobin: 14.5 g/dL (ref 13.0–17.0)
MCH: 29.2 pg (ref 26.0–34.0)
MCHC: 33 g/dL (ref 30.0–36.0)
MCV: 88.5 fL (ref 80.0–100.0)
Platelets: 314 10*3/uL (ref 150–400)
RBC: 4.97 MIL/uL (ref 4.22–5.81)
RDW: 15.5 % (ref 11.5–15.5)
WBC: 8.4 10*3/uL (ref 4.0–10.5)
nRBC: 0 % (ref 0.0–0.2)

## 2023-02-02 LAB — BASIC METABOLIC PANEL
Anion gap: 10 (ref 5–15)
BUN: 23 mg/dL (ref 8–23)
CO2: 21 mmol/L — ABNORMAL LOW (ref 22–32)
Calcium: 9 mg/dL (ref 8.9–10.3)
Chloride: 101 mmol/L (ref 98–111)
Creatinine, Ser: 1.63 mg/dL — ABNORMAL HIGH (ref 0.61–1.24)
GFR, Estimated: 47 mL/min — ABNORMAL LOW (ref >60)
Glucose, Bld: 105 mg/dL — ABNORMAL HIGH (ref 70–99)
Potassium: 4 mmol/L (ref 3.5–5.1)
Sodium: 132 mmol/L — ABNORMAL LOW (ref 135–145)

## 2023-02-02 LAB — CBG MONITORING, ED: Glucose-Capillary: 106 mg/dL — ABNORMAL HIGH (ref 70–99)

## 2023-02-02 LAB — TROPONIN I (HIGH SENSITIVITY): Troponin I (High Sensitivity): 9 ng/L (ref <18)

## 2023-02-02 MED ORDER — SODIUM CHLORIDE 0.9 % IV SOLN
1.0000 g | Freq: Once | INTRAVENOUS | Status: AC
  Administered 2023-02-03: 1 g via INTRAVENOUS
  Filled 2023-02-02: qty 10

## 2023-02-02 MED ORDER — SODIUM CHLORIDE 0.9 % IV SOLN
500.0000 mg | Freq: Once | INTRAVENOUS | Status: AC
  Administered 2023-02-03: 500 mg via INTRAVENOUS
  Filled 2023-02-02: qty 5

## 2023-02-02 NOTE — ED Provider Notes (Incomplete)
[place] EMERGENCY DEPARTMENT AT [facility] Provider Note   CSN: [id number] Arrival date & time: 02/02/23  1747     History {Add pertinent medical, surgical, social history, OB history to HPI:1} Chief Complaint  Patient presents with  . Loss of Consciousness    Mark Waller is a 63 y.o. male.  63 year old with history of COPD, CAD, prior CVA presents to the ED for evaluation of recurrent syncope.   The history is provided by the patient.  Loss of Consciousness Episode history:  Multiple Most recent episode:  Today Progression:  Worsening Chronicity:  New Context: normal activity and standing up   Associated symptoms: dizziness   Associated symptoms: no chest pain, no fever, no headaches, no nausea, no shortness of breath and no vomiting        Home Medications Prior to Admission medications   Medication Sig Start Date End Date Taking? Authorizing Provider  albuterol (VENTOLIN HFA) 108 (90 Base) MCG/ACT inhaler Inhale 2 puffs into the lungs every 6 (six) hours as needed. 11/09/22 11/09/23  [provider]  amLODipine (NORVASC) 5 MG tablet Take 5 mg by mouth daily.    [provider]  fluticasone (FLONASE) 50 MCG/ACT nasal spray Place 2 sprays into both nostrils daily. 01/09/20   [provider]  nitroGLYCERIN (NITROSTAT) 0.3 MG SL tablet Place 0.3 mg under the tongue every 5 (five) minutes as needed. 10/13/22   [provider]  olmesartan (BENICAR) 20 MG tablet Take 1 tablet (20 mg total) by mouth daily. 01/19/23   Nyoka Cowden, MD  omeprazole (PRILOSEC) 40 MG capsule Take 40 mg by mouth daily.    [provider]  rosuvastatin (CRESTOR) 40 MG tablet Take 40 mg by mouth daily. 10/02/22   [provider]  sildenafil (VIAGRA) 100 MG tablet You can either take 1 tab po daily prn or 1/2 tab po daily prn with 10mg  of tadalafil.   Do not take your nitroglycerin if you use these. 12/17/22   Bjorn Pippin, MD  tamsulosin  (FLOMAX) 0.4 MG CAPS capsule Take 1 capsule (0.4 mg total) by mouth daily. 01/28/23   Bjorn Pippin, MD  Tiotropium Bromide Monohydrate (SPIRIVA RESPIMAT) 2.5 MCG/ACT AERS Inhale 2 puffs into the lungs daily. 01/19/23   Nyoka Cowden, MD      Allergies    Levofloxacin, Aspirin, Penicillins, Codeine, Hydrocodone, and Tramadol    Review of Systems   Review of Systems  Constitutional:  Negative for fever.  Respiratory:  Negative for shortness of breath.   Cardiovascular:  Positive for syncope. Negative for chest pain.  Gastrointestinal:  Negative for abdominal pain, nausea and vomiting.  Neurological:  Positive for dizziness. Negative for headaches.  All other systems reviewed and are negative.   Physical Exam Updated Vital Signs BP 110/61   Pulse 64   Temp 98.3 F (36.8 C) (Oral)   Resp (!) 22   Ht 6' (1.829 m)   Wt 86.2 kg   SpO2 94%   BMI 25.77 kg/m  Physical Exam Constitutional:      General: He is not in acute distress.    Appearance: Normal appearance.  HENT:     Head: Normocephalic.     Nose: Nose normal.  Eyes:     Extraocular Movements: Extraocular movements intact.     Conjunctiva/sclera: Conjunctivae normal.     Pupils: Pupils are equal, round, and reactive to light.  Cardiovascular:     Rate and Rhythm: Normal  rate.     Pulses: Normal pulses.  Pulmonary:     Effort: Pulmonary effort is normal.  Abdominal:     Palpations: Abdomen is soft.  Musculoskeletal:        General: Normal range of motion.     Cervical back: Normal range of motion and neck supple.  Skin:    General: Skin is warm and dry.  Neurological:     General: No focal deficit present.     Mental Status: He is alert and oriented to person, place, and time.  Psychiatric:        Mood and Affect: Mood normal.        Behavior: Behavior normal.     ED Results / Procedures / Treatments   Labs (all labs ordered are listed, but only abnormal results are displayed) Labs Reviewed  BASIC  METABOLIC PANEL - Abnormal; Notable for the following components:      Result Value   Sodium 132 (*)    CO2 21 (*)    Glucose, Bld 105 (*)    Creatinine, Ser 1.63 (*)    GFR, Estimated 47 (*)    All other components within normal limits  CBG MONITORING, ED - Abnormal; Notable for the following components:   Glucose-Capillary 106 (*)    All other components within normal limits  CBC  URINALYSIS, ROUTINE W REFLEX MICROSCOPIC  TROPONIN I (HIGH SENSITIVITY)    EKG None  Radiology DG Chest Portable 1 View Result Date: 02/02/2023 CLINICAL DATA:  Shortness of breath.  History of COPD. EXAM: PORTABLE CHEST 1 VIEW COMPARISON:  CT 10/08/2022 FINDINGS: The heart is upper normal in size, likely accentuated by portable technique. Normal mediastinal contours. Chronic hyperinflation. Moderate bronchial thickening. There are ill-defined opacities at the left lung base. No pulmonary edema. No pleural effusion. Remote left rib fracture. IMPRESSION: 1. Chronic hyperinflation and bronchial thickening. 2. Ill-defined opacities at the left lung base, atelectasis versus pneumonia. Electronically Signed   By: Narda Rutherford M.D.   On: 02/02/2023 22:57   CT Head Wo Contrast Result Date: 02/02/2023 CLINICAL DATA:  Syncope. EXAM: CT HEAD WITHOUT CONTRAST TECHNIQUE: Contiguous axial images were obtained from the base of the skull through the vertex without intravenous contrast. RADIATION DOSE REDUCTION: This exam was performed according to the departmental dose-optimization program which includes automated exposure control, adjustment of the mA and/or kV according to patient size and/or use of iterative reconstruction technique. COMPARISON:  CT head 07/06/2008 FINDINGS: Brain: No evidence of acute large vascular territory infarction, hemorrhage, hydrocephalus, extra-axial collection or mass lesion/mass effect. 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 Vascular: No  hyperdense vessel.  Calcific atherosclerosis. Skull: No acute fracture. Sinuses/Orbits: Clear sinuses.  No acute orbital findings. Other: No mastoid effusions. IMPRESSION: 1. No evidence of acute intracranial abnormality. 2. Remote medial left temporal infarct. 3. Remote bilateral thalamic lacunar infarcts. Electronically Signed   By: Feliberto Harts M.D.   On: 02/02/2023 22:54    Procedures Procedures  {Document cardiac monitor, telemetry assessment procedure when appropriate:1}  Medications Ordered in ED Medications - No data to display  ED Course/ Medical Decision Making/ A&P   {   Click here for ABCD2, HEART and other calculatorsREFRESH Note before signing :1}                              Medical Decision Making Amount and/or Complexity of Data Reviewed Labs:  ordered.   Patient presents with symptoms consistent with syncope, possibly due to dehydration with AKI. No indication of life threatening hemorrhage. Presentation not consistent with seizures, life threatening anemia, ACS. Given age, history, AKI, and possible CAP, will request admission.  {Document critical care time when appropriate:1} {Document review of labs and clinical decision tools ie heart score, Chads2Vasc2 etc:1}  {Document your independent review of radiology images, and any outside records:1} {Document your discussion with family members, caretakers, and with consultants:1} {Document social determinants of health affecting pt's care:1} {Document your decision making why or why not admission, treatments were needed:1} Final Clinical Impression(s) / ED Diagnoses Final diagnoses:  Syncope and collapse  AKI (acute kidney injury) (HCC)  Community acquired pneumonia, unspecified laterality    Rx / DC Orders ED Discharge Orders     None

## 2023-02-02 NOTE — ED Triage Notes (Signed)
Pt states he has syncopal episodes multiple times over past 2-3 days. Pt to have colonoscopy soon.

## 2023-02-02 NOTE — ED Provider Notes (Signed)
[place] EMERGENCY DEPARTMENT AT [facility] Provider Note   CSN: [id number] Arrival date & time: 02/02/23  1747     History  Chief Complaint  Patient presents with   Loss of Consciousness    Mark Waller is a 63 y.o. male.  63 year old with history of COPD, CAD, prior CVA presents to the ED for evaluation of recurrent syncope.   The history is provided by the patient.  Loss of Consciousness Episode history:  Multiple Most recent episode:  Today Progression:  Worsening Chronicity:  New Context: normal activity and standing up   Associated symptoms: dizziness   Associated symptoms: no chest pain, no fever, no headaches, no nausea, no shortness of breath and no vomiting        Home Medications Prior to Admission medications   Medication Sig Start Date End Date Taking? Authorizing Provider  albuterol (VENTOLIN HFA) 108 (90 Base) MCG/ACT inhaler Inhale 2 puffs into the lungs every 6 (six) hours as needed. 11/09/22 11/09/23  [provider]  amLODipine (NORVASC) 5 MG tablet Take 5 mg by mouth daily.    [provider]  fluticasone (FLONASE) 50 MCG/ACT nasal spray Place 2 sprays into both nostrils daily. 01/09/20   [provider]  nitroGLYCERIN (NITROSTAT) 0.3 MG SL tablet Place 0.3 mg under the tongue every 5 (five) minutes as needed. 10/13/22   [provider]  olmesartan (BENICAR) 20 MG tablet Take 1 tablet (20 mg total) by mouth daily. 01/19/23   Nyoka Cowden, MD  omeprazole (PRILOSEC) 40 MG capsule Take 40 mg by mouth daily.    [provider]  rosuvastatin (CRESTOR) 40 MG tablet Take 40 mg by mouth daily. 10/02/22   [provider]  sildenafil (VIAGRA) 100 MG tablet You can either take 1 tab po daily prn or 1/2 tab po daily prn with 10mg  of tadalafil.   Do not take your nitroglycerin if you use these. 12/17/22   Bjorn Pippin, MD  tamsulosin (FLOMAX) 0.4 MG CAPS capsule Take 1 capsule (0.4 mg total) by mouth  daily. 01/28/23   Bjorn Pippin, MD  Tiotropium Bromide Monohydrate (SPIRIVA RESPIMAT) 2.5 MCG/ACT AERS Inhale 2 puffs into the lungs daily. 01/19/23   Nyoka Cowden, MD      Allergies    Levofloxacin, Aspirin, Penicillins, Codeine, Hydrocodone, and Tramadol    Review of Systems   Review of Systems  Constitutional:  Negative for fever.  Respiratory:  Negative for shortness of breath.   Cardiovascular:  Positive for syncope. Negative for chest pain.  Gastrointestinal:  Negative for abdominal pain, nausea and vomiting.  Neurological:  Positive for dizziness. Negative for headaches.  All other systems reviewed and are negative.   Physical Exam Updated Vital Signs BP 110/61   Pulse 64   Temp 98.3 F (36.8 C) (Oral)   Resp (!) 22   Ht 6' (1.829 m)   Wt 86.2 kg   SpO2 94%   BMI 25.77 kg/m  Physical Exam Constitutional:      General: He is not in acute distress.    Appearance: Normal appearance.  HENT:     Head: Normocephalic.     Nose: Nose normal.  Eyes:     Extraocular Movements: Extraocular movements intact.     Conjunctiva/sclera: Conjunctivae normal.     Pupils: Pupils are equal, round, and reactive to light.  Cardiovascular:     Rate and Rhythm: Normal rate.     Pulses: Normal pulses.  Pulmonary:     Effort: Pulmonary effort is normal.  Abdominal:     Palpations: Abdomen is soft.  Musculoskeletal:        General: Normal range of motion.     Cervical back: Normal range of motion and neck supple.  Skin:    General: Skin is warm and dry.  Neurological:     General: No focal deficit present.     Mental Status: He is alert and oriented to person, place, and time.  Psychiatric:        Mood and Affect: Mood normal.        Behavior: Behavior normal.     ED Results / Procedures / Treatments   Labs (all labs ordered are listed, but only abnormal results are displayed) Labs Reviewed  BASIC METABOLIC PANEL - Abnormal; Notable for the following components:       Result Value   Sodium 132 (*)    CO2 21 (*)    Glucose, Bld 105 (*)    Creatinine, Ser 1.63 (*)    GFR, Estimated 47 (*)    All other components within normal limits  CBG MONITORING, ED - Abnormal; Notable for the following components:   Glucose-Capillary 106 (*)    All other components within normal limits  CBC  URINALYSIS, ROUTINE W REFLEX MICROSCOPIC  TROPONIN I (HIGH SENSITIVITY)    EKG EKG Interpretation Date/Time:  Tuesday February 02 2023 18:21:19 EST Ventricular Rate:  62 PR Interval:  238 QRS Duration:  94 QT Interval:  414 QTC Calculation: 420 R Axis:   47  Text Interpretation: Sinus rhythm with 1st degree A-V block Premature ventricular complexes Confirmed by Gloris Manchester [phone]) on 02/02/2023 11:35:46 PM  Radiology DG Chest Portable 1 View Result Date: 02/02/2023 CLINICAL DATA:  Shortness of breath.  History of COPD. EXAM: PORTABLE CHEST 1 VIEW COMPARISON:  CT 10/08/2022 FINDINGS: The heart is upper normal in size, likely accentuated by portable technique. Normal mediastinal contours. Chronic hyperinflation. Moderate bronchial thickening. There are ill-defined opacities at the left lung base. No pulmonary edema. No pleural effusion. Remote left rib fracture. IMPRESSION: 1. Chronic hyperinflation and bronchial thickening. 2. Ill-defined opacities at the left lung base, atelectasis versus pneumonia. Electronically Signed   By: Narda Rutherford M.D.   On: 02/02/2023 22:57   CT Head Wo Contrast Result Date: 02/02/2023 CLINICAL DATA:  Syncope. EXAM: CT HEAD WITHOUT CONTRAST TECHNIQUE: Contiguous axial images were obtained from the base of the skull through the vertex without intravenous contrast. RADIATION DOSE REDUCTION: This exam was performed according to the departmental dose-optimization program which includes automated exposure control, adjustment of the mA and/or kV according to patient size and/or use of iterative reconstruction technique. COMPARISON:  CT head 07/06/2008  FINDINGS: Brain: No evidence of acute large vascular territory infarction, hemorrhage, hydrocephalus, extra-axial collection or mass lesion/mass effect. 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 Vascular: No hyperdense vessel.  Calcific atherosclerosis. Skull: No acute fracture. Sinuses/Orbits: Clear sinuses.  No acute orbital findings. Other: No mastoid effusions. IMPRESSION: 1. No evidence of acute intracranial abnormality. 2. Remote medial left temporal infarct. 3. Remote bilateral thalamic lacunar infarcts. Electronically Signed   By: Feliberto Harts M.D.   On: 02/02/2023 22:54    Procedures Procedures    Medications Ordered in ED Medications  cefTRIAXone (ROCEPHIN) 1 g in sodium chloride 0.9 % 100 mL IVPB (has no administration in time range)  azithromycin (ZITHROMAX) 500 mg in sodium chloride 0.9 %  250 mL IVPB (has no administration in time range)    ED Course/ Medical Decision Making/ A&P                                 Medical Decision Making Amount and/or Complexity of Data Reviewed Labs: ordered.   Patient presents with symptoms consistent with syncope, possibly due to dehydration with AKI. No indication of life threatening hemorrhage. Presentation not consistent with seizures, life threatening anemia, ACS. Given age, history, AKI, and possible CAP, will request admission. Rocephin and azithromycin ordered.  Discussed with hospitalist ([place])--will admit.        Final Clinical Impression(s) / ED Diagnoses Final diagnoses:  Syncope and collapse  AKI (acute kidney injury) (HCC)  Community acquired pneumonia, unspecified laterality    Rx / DC Orders ED Discharge Orders     None         Felicie Morn, NP 02/03/23 0015    Gloris Manchester, MD 02/03/23 [phone]

## 2023-02-03 ENCOUNTER — Ambulatory Visit: Payer: 59 | Admitting: Urology

## 2023-02-03 ENCOUNTER — Inpatient Hospital Stay (HOSPITAL_COMMUNITY): Payer: 59

## 2023-02-03 DIAGNOSIS — J189 Pneumonia, unspecified organism: Secondary | ICD-10-CM

## 2023-02-03 DIAGNOSIS — I951 Orthostatic hypotension: Secondary | ICD-10-CM | POA: Diagnosis present

## 2023-02-03 DIAGNOSIS — N179 Acute kidney failure, unspecified: Secondary | ICD-10-CM | POA: Diagnosis present

## 2023-02-03 DIAGNOSIS — R55 Syncope and collapse: Principal | ICD-10-CM

## 2023-02-03 DIAGNOSIS — Z8551 Personal history of malignant neoplasm of bladder: Secondary | ICD-10-CM | POA: Diagnosis not present

## 2023-02-03 DIAGNOSIS — I10 Essential (primary) hypertension: Secondary | ICD-10-CM | POA: Insufficient documentation

## 2023-02-03 DIAGNOSIS — I440 Atrioventricular block, first degree: Secondary | ICD-10-CM | POA: Diagnosis present

## 2023-02-03 DIAGNOSIS — Z8673 Personal history of transient ischemic attack (TIA), and cerebral infarction without residual deficits: Secondary | ICD-10-CM | POA: Diagnosis not present

## 2023-02-03 DIAGNOSIS — F17210 Nicotine dependence, cigarettes, uncomplicated: Secondary | ICD-10-CM | POA: Diagnosis present

## 2023-02-03 DIAGNOSIS — J440 Chronic obstructive pulmonary disease with acute lower respiratory infection: Secondary | ICD-10-CM | POA: Diagnosis present

## 2023-02-03 DIAGNOSIS — J9811 Atelectasis: Secondary | ICD-10-CM | POA: Diagnosis present

## 2023-02-03 DIAGNOSIS — E782 Mixed hyperlipidemia: Secondary | ICD-10-CM

## 2023-02-03 DIAGNOSIS — Z880 Allergy status to penicillin: Secondary | ICD-10-CM | POA: Diagnosis not present

## 2023-02-03 DIAGNOSIS — Z8249 Family history of ischemic heart disease and other diseases of the circulatory system: Secondary | ICD-10-CM | POA: Diagnosis not present

## 2023-02-03 DIAGNOSIS — K219 Gastro-esophageal reflux disease without esophagitis: Secondary | ICD-10-CM

## 2023-02-03 DIAGNOSIS — Z881 Allergy status to other antibiotic agents status: Secondary | ICD-10-CM | POA: Diagnosis not present

## 2023-02-03 DIAGNOSIS — Z79899 Other long term (current) drug therapy: Secondary | ICD-10-CM | POA: Diagnosis not present

## 2023-02-03 DIAGNOSIS — I2510 Atherosclerotic heart disease of native coronary artery without angina pectoris: Secondary | ICD-10-CM | POA: Diagnosis present

## 2023-02-03 DIAGNOSIS — N403 Nodular prostate with lower urinary tract symptoms: Secondary | ICD-10-CM | POA: Insufficient documentation

## 2023-02-03 DIAGNOSIS — K7460 Unspecified cirrhosis of liver: Secondary | ICD-10-CM | POA: Diagnosis present

## 2023-02-03 DIAGNOSIS — I350 Nonrheumatic aortic (valve) stenosis: Secondary | ICD-10-CM | POA: Diagnosis present

## 2023-02-03 DIAGNOSIS — E871 Hypo-osmolality and hyponatremia: Secondary | ICD-10-CM | POA: Diagnosis present

## 2023-02-03 DIAGNOSIS — Z886 Allergy status to analgesic agent status: Secondary | ICD-10-CM | POA: Diagnosis not present

## 2023-02-03 DIAGNOSIS — N400 Enlarged prostate without lower urinary tract symptoms: Secondary | ICD-10-CM

## 2023-02-03 DIAGNOSIS — Z885 Allergy status to narcotic agent status: Secondary | ICD-10-CM | POA: Diagnosis not present

## 2023-02-03 DIAGNOSIS — J449 Chronic obstructive pulmonary disease, unspecified: Secondary | ICD-10-CM

## 2023-02-03 DIAGNOSIS — Z860100 Personal history of colon polyps, unspecified: Secondary | ICD-10-CM | POA: Diagnosis not present

## 2023-02-03 LAB — COMPREHENSIVE METABOLIC PANEL
ALT: 93 U/L — ABNORMAL HIGH (ref 0–44)
AST: 91 U/L — ABNORMAL HIGH (ref 15–41)
Albumin: 3.6 g/dL (ref 3.5–5.0)
Alkaline Phosphatase: 86 U/L (ref 38–126)
Anion gap: 7 (ref 5–15)
BUN: 18 mg/dL (ref 8–23)
CO2: 24 mmol/L (ref 22–32)
Calcium: 9 mg/dL (ref 8.9–10.3)
Chloride: 103 mmol/L (ref 98–111)
Creatinine, Ser: 1.09 mg/dL (ref 0.61–1.24)
GFR, Estimated: >60 mL/min (ref >60)
Glucose, Bld: 112 mg/dL — ABNORMAL HIGH (ref 70–99)
Potassium: 4.2 mmol/L (ref 3.5–5.1)
Sodium: 134 mmol/L — ABNORMAL LOW (ref 135–145)
Total Bilirubin: 0.9 mg/dL (ref 0.0–1.2)
Total Protein: 8.2 g/dL — ABNORMAL HIGH (ref 6.5–8.1)

## 2023-02-03 LAB — ECHOCARDIOGRAM COMPLETE
AR max vel: 1.87 cm2
AV Area VTI: 2.12 cm2
AV Area mean vel: 1.84 cm2
AV Mean grad: 4.7 mm[Hg]
AV Peak grad: 9.2 mm[Hg]
AV Vena cont: 0.6 cm
Ao pk vel: 1.51 m/s
Area-P 1/2: 3.34 cm2
Height: 72 in
S' Lateral: 2.6 cm
Weight: 3040 [oz_av]

## 2023-02-03 LAB — URINALYSIS, ROUTINE W REFLEX MICROSCOPIC
Bacteria, UA: NONE SEEN
Bilirubin Urine: NEGATIVE
Glucose, UA: NEGATIVE mg/dL
Hgb urine dipstick: NEGATIVE
Ketones, ur: NEGATIVE mg/dL
Leukocytes,Ua: NEGATIVE
Nitrite: NEGATIVE
Protein, ur: 30 mg/dL — AB
Specific Gravity, Urine: 1.015 (ref 1.005–1.030)
pH: 5 (ref 5.0–8.0)

## 2023-02-03 LAB — CBC
HCT: 43.7 % (ref 39.0–52.0)
Hemoglobin: 14.3 g/dL (ref 13.0–17.0)
MCH: 29.3 pg (ref 26.0–34.0)
MCHC: 32.7 g/dL (ref 30.0–36.0)
MCV: 89.5 fL (ref 80.0–100.0)
Platelets: 283 10*3/uL (ref 150–400)
RBC: 4.88 MIL/uL (ref 4.22–5.81)
RDW: 15.2 % (ref 11.5–15.5)
WBC: 6.2 10*3/uL (ref 4.0–10.5)
nRBC: 0 % (ref 0.0–0.2)

## 2023-02-03 LAB — PHOSPHORUS: Phosphorus: 3.1 mg/dL (ref 2.5–4.6)

## 2023-02-03 LAB — PROCALCITONIN: Procalcitonin: <0.1 ng/mL

## 2023-02-03 LAB — MAGNESIUM: Magnesium: 2.2 mg/dL (ref 1.7–2.4)

## 2023-02-03 LAB — TROPONIN I (HIGH SENSITIVITY): Troponin I (High Sensitivity): 9 ng/L (ref <18)

## 2023-02-03 MED ORDER — OXYCODONE HCL 5 MG PO TABS: 5.0000 mg | ORAL_TABLET | Freq: Four times a day (QID) | ORAL | Status: DC | PRN

## 2023-02-03 MED ORDER — ACETAMINOPHEN 325 MG PO TABS: 650.0000 mg | ORAL_TABLET | Freq: Three times a day (TID) | ORAL | Status: DC | PRN

## 2023-02-03 MED ORDER — UMECLIDINIUM BROMIDE 62.5 MCG/ACT IN AEPB
1.0000 | INHALATION_SPRAY | Freq: Every day | RESPIRATORY_TRACT | Status: DC
  Administered 2023-02-04 – 2023-02-05 (×2): 1 via RESPIRATORY_TRACT
  Filled 2023-02-03: qty 7

## 2023-02-03 MED ORDER — IRBESARTAN 150 MG PO TABS
150.0000 mg | ORAL_TABLET | Freq: Every day | ORAL | Status: DC
  Administered 2023-02-03 – 2023-02-04 (×2): 150 mg via ORAL
  Filled 2023-02-03 (×2): qty 1

## 2023-02-03 MED ORDER — TAMSULOSIN HCL 0.4 MG PO CAPS
0.4000 mg | ORAL_CAPSULE | Freq: Every day | ORAL | Status: DC
  Administered 2023-02-03 – 2023-02-05 (×3): 0.4 mg via ORAL
  Filled 2023-02-03 (×3): qty 1

## 2023-02-03 MED ORDER — PANTOPRAZOLE SODIUM 40 MG PO TBEC
40.0000 mg | DELAYED_RELEASE_TABLET | Freq: Every day | ORAL | Status: DC
  Administered 2023-02-03 – 2023-02-05 (×3): 40 mg via ORAL
  Filled 2023-02-03 (×3): qty 1

## 2023-02-03 MED ORDER — AMLODIPINE BESYLATE 5 MG PO TABS
5.0000 mg | ORAL_TABLET | Freq: Every day | ORAL | Status: DC
  Administered 2023-02-03 – 2023-02-04 (×2): 5 mg via ORAL
  Filled 2023-02-03 (×2): qty 1

## 2023-02-03 MED ORDER — ENOXAPARIN SODIUM 40 MG/0.4ML IJ SOSY: 40.0000 mg | PREFILLED_SYRINGE | INTRAMUSCULAR | Status: DC

## 2023-02-03 MED ORDER — NITROGLYCERIN 0.3 MG SL SUBL: 0.3000 mg | SUBLINGUAL_TABLET | SUBLINGUAL | Status: DC | PRN

## 2023-02-03 MED ORDER — ALBUTEROL SULFATE (2.5 MG/3ML) 0.083% IN NEBU: 3.0000 mL | INHALATION_SOLUTION | Freq: Four times a day (QID) | RESPIRATORY_TRACT | Status: DC | PRN

## 2023-02-03 MED ORDER — ACETAMINOPHEN 650 MG RE SUPP: 650.0000 mg | Freq: Four times a day (QID) | RECTAL | Status: DC | PRN

## 2023-02-03 MED ORDER — ACETAMINOPHEN 325 MG PO TABS: 650.0000 mg | ORAL_TABLET | Freq: Four times a day (QID) | ORAL | Status: DC | PRN

## 2023-02-03 MED ORDER — ROSUVASTATIN CALCIUM 20 MG PO TABS
40.0000 mg | ORAL_TABLET | Freq: Every day | ORAL | Status: DC
  Administered 2023-02-03 – 2023-02-05 (×3): 40 mg via ORAL
  Filled 2023-02-03 (×3): qty 2

## 2023-02-03 MED ORDER — TIOTROPIUM BROMIDE MONOHYDRATE 2.5 MCG/ACT IN AERS: 2.0000 | INHALATION_SPRAY | Freq: Every day | RESPIRATORY_TRACT | Status: DC

## 2023-02-03 MED ORDER — ONDANSETRON HCL 4 MG/2ML IJ SOLN: 4.0000 mg | Freq: Four times a day (QID) | INTRAMUSCULAR | Status: DC | PRN

## 2023-02-03 MED ORDER — ONDANSETRON HCL 4 MG PO TABS: 4.0000 mg | ORAL_TABLET | Freq: Four times a day (QID) | ORAL | Status: DC | PRN

## 2023-02-03 MED ORDER — SODIUM CHLORIDE 0.9 % IV SOLN: INTRAVENOUS | Status: AC

## 2023-02-03 MED ORDER — SODIUM CHLORIDE 0.9 % IV SOLN: INTRAVENOUS | Status: DC

## 2023-02-03 NOTE — TOC Initial Note (Signed)
Transition of Care [facility]) - Initial/Assessment Note    Patient Details  Name: Mark Waller MRN: [id number] Date of Birth: [birth year]  [facility] CM/SW Contact:    Villa Herb, LCSWA Phone Number: 02/03/2023, 3:39 PM  Clinical Narrative:                 CSW updated that PT is recommending OP PT for pt at D/C. CSW met with pt at bedside, pt prefers HH due to transportation. Pt does not have an agency preference. CSW to make [facility] referral. Pt has needed DME to use in the home if needed. TOC to follow.   Expected Discharge Plan: Home w Home Health Services Barriers to Discharge: Continued Medical Work up   Patient Goals and CMS Choice Patient states their goals for this hospitalization and ongoing recovery are:: Return home CMS Medicare.gov Compare Post Acute Care list provided to:: Patient Choice offered to / list presented to : Patient      Expected Discharge Plan and Services In-house Referral: Clinical Social Work Discharge Planning Services: CM Consult   Living arrangements for the past 2 months: Single Family Home                                      Prior Living Arrangements/Services Living arrangements for the past 2 months: Single Family Home Lives with:: Self Patient language and need for interpreter reviewed:: Yes Do you feel safe going back to the place where you live?: Yes      Need for Family Participation in Patient Care: Yes (Comment) Care giver support system in place?: Yes (comment) Current home services: DME Criminal Activity/Legal Involvement Pertinent to Current Situation/Hospitalization: No - Comment as needed  Activities of Daily Living   ADL Screening (condition at time of admission) Independently performs ADLs?: Yes (appropriate for developmental age) Is the patient deaf or have difficulty hearing?: No Does the patient have difficulty seeing, even when wearing glasses/contacts?: No Does the patient have difficulty  concentrating, remembering, or making decisions?: No  Permission Sought/Granted                  Emotional Assessment Appearance:: Appears stated age Attitude/Demeanor/Rapport: Engaged Affect (typically observed): Accepting Orientation: : Oriented to Self, Oriented to Place, Oriented to  Time, Oriented to Situation Alcohol / Substance Use: Not Applicable Psych Involvement: No (comment)  Admission diagnosis:  Syncope and collapse [R55] AKI (acute kidney injury) (HCC) [N17.9] Community acquired pneumonia, unspecified laterality [J18.9] Patient Active Problem List   Diagnosis Date Noted   Syncope and collapse 02/03/2023   AKI (acute kidney injury) (HCC) 02/03/2023   CAP (community acquired pneumonia) 02/03/2023   BPH (benign prostatic hyperplasia) 02/03/2023   Cigarette smoker 01/19/2023   Allergic rhinitis 12/14/2022   Chronic kidney disease, stage III (moderate) (HCC) 12/14/2022   Chronic sinusitis 12/14/2022   DDD (degenerative disc disease), lumbar 12/14/2022   OSA (obstructive sleep apnea) 12/14/2022   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   Solitary pulmonary nodule on lung CT 10/14/2022   Elevated PSA, between 10 and less than 20 ng/ml 10/02/2022   Malignant neoplasm of urinary bladder (HCC) 10/02/2022   Nonrheumatic aortic valve stenosis 10/09/2021   Elevated liver enzymes 09/07/2021   Kidney lesion, native, right 09/07/2021   Alcohol abuse 09/06/2021   Cerebrovascular accident (CVA) (HCC) 09/06/2021   GERD (gastroesophageal reflux disease) 04/17/2017   History of  arthroscopy of left shoulder 01/18/2017   Arthritis of left acromioclavicular joint 12/01/2016   PVD (peripheral vascular disease) (HCC) 12/20/2014   COPD (chronic obstructive pulmonary disease) (HCC) 12/18/2013   Coronary atherosclerosis of native coronary artery 12/09/2010   Tobacco abuse 12/09/2010   History of stroke 12/09/2010   Mixed hyperlipidemia 12/09/2010   Essential hypertension,  benign 12/09/2010   PCP:  [facility] Pharmacy:   [facility] [address] [place] - [address] [place] [address] Phone: [phone] Fax: [phone]     Social Drivers of Health (SDOH) Social History: SDOH Screenings   Food Insecurity: No Food Insecurity (02/03/2023)  Housing: Patient Declined (02/03/2023)  Transportation Needs: Patient Declined (02/03/2023)  Utilities: Patient Declined (02/03/2023)  Financial Resource Strain: Low Risk  (10/30/2022)   Received from [facility]  Physical Activity: Inactive (10/30/2022)   Received from [facility]  Social Connections: Unknown (10/30/2022)   Received from [facility]  Stress: No Stress Concern Present (11/26/2022)   Received from [facility]  Tobacco Use: High Risk (02/02/2023)  Health Literacy: Low Risk  (10/30/2022)   Received from [facility]   SDOH Interventions:     Readmission Risk Interventions     No data to display
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
## 2023-02-03 NOTE — Progress Notes (Signed)
*  PRELIMINARY RESULTS* Echocardiogram 2D Echocardiogram has been performed.  Mark Waller 02/03/2023, 1:41 PM

## 2023-02-03 NOTE — Evaluation (Signed)
Physical Therapy Brief Evaluation and Discharge Note Patient Details Name: Mark Waller MRN: [id number] DOB: 03-25-1960 Today's Date: 02/03/2023   History of Present Illness  a 63 y.o. male with medical history significant of hypertension, hyperlipidemia, GERD, COPD who presents to the emergency department due to recurrent syncopal episodes within the last 4 days.  Patient says that he has had about 3 episodes in which he passes out and within few minutes regained consciousness without any period of confusion after recovering from the syncope.  He denies hitting his head or sustaining any injury.  Clinical Impression  Pt tolerating evaluation well. BP taken as follows: Supine - 120/77; Sitting: 123/71; Stand 105/61. Patient is performing near at functional baseline, no safety concerns noted throughout evaluation. Pt with slow, guarded movement but this seems baseline as well. RPE 8/10 during ambulation. Currently, patient is safe to DC home and shows falls/balance risk. Outpatient PT referral would be of benefit. PT to sign off at this time.  Please reconsult acute PT services if functional changes occur while admitted.  Thank you.       PT Assessment    Assistance Needed at Discharge       Equipment Recommendations    Recommendations for Other Services       Precautions/Restrictions Restrictions Weight Bearing Restrictions Per Provider Order: No        Mobility  Bed Mobility          Transfers Overall transfer level: Needs assistance   Transfers: Sit to/from Stand Sit to Stand: Supervision           General transfer comment: slow, labored but steady    Ambulation/Gait Ambulation/Gait assistance: Modified independent (Device/Increase time) Gait Distance (Feet): 75 Feet Assistive device: None Gait Pattern/deviations: WFL(Within Functional Limits)   General Gait Details: slow, wide BOS ambulation @ 23ft  Home Activity Instructions    Stairs             Modified Rankin (Stroke Patients Only)        Balance                          Pertinent Vitals/Pain   Pain Assessment Pain Assessment: 0-10 Pain Score: 3  Pain Location: low back Pain Descriptors / Indicators: Aching     Home Living   Living Arrangements: Alone       Home Equipment: Cane - single point        Prior Function        UE/LE Assessment               Communication   Communication Communication: No apparent difficulties Cueing Techniques: Verbal cues;Tactile cues     Cognition         General Comments      Exercises     Assessment/Plan    PT Problem List Decreased activity tolerance;Decreased balance;Decreased mobility       PT Visit Diagnosis Unsteadiness on feet (R26.81)    No Skilled PT Patient is modified independent with all activity/mobility;Patient is supervision for all activity/mobility   Co-evaluation                AMPAC 6 Clicks Help needed turning from your back to your side while in a flat bed without using bedrails?: None Help needed moving from lying on your back to sitting on the side of a flat bed without using bedrails?: None Help needed moving to and from a bed  to a chair (including a wheelchair)?: None Help needed standing up from a chair using your arms (e.g., wheelchair or bedside chair)?: None Help needed to walk in hospital room?: None Help needed climbing 3-5 steps with a railing? : A Lot 6 Click Score: 22      End of Session Equipment Utilized During Treatment: Gait belt Activity Tolerance: Patient tolerated treatment well Patient left: in bed Nurse Communication: Mobility status PT Visit Diagnosis: Unsteadiness on feet (R26.81)     Time: 1610-9604 PT Time Calculation (min) (ACUTE ONLY): 19 min  Charges:   PT Evaluation $PT Eval Low Complexity: 1 Low      Elie Goody, DPT [facility] [place] [address] [phone] office  Nelida Meuse  02/03/2023, 12:34 PM

## 2023-02-03 NOTE — Progress Notes (Signed)
PROGRESS NOTE    Patient: Mark Waller                            PCP: [facility]                    DOB: 1960/05/20            DOA: 02/02/2023 [id number]             DOS: 02/03/2023, 11:46 AM   LOS: 0 days   Date of Service: The patient was seen and examined on 02/03/2023  Subjective:   The patient was seen and examined this morning. Hemodynamically stable.  With exception of bradycardia heart rate 48-50 No issues overnight .  Brief Narrative:    ORVIN NETTER is a 63 y.o. male with medical history significant of hypertension, hyperlipidemia, GERD, COPD who presents to the emergency department due to recurrent syncopal episodes within the last 4 days.  Patient says that he has had about 3 episodes in which he passes out and within few minutes regained consciousness without any period of confusion after recovering from the syncope.  He denies hitting his head or sustaining any injury.   ED Course:  Found hemodynamically stable, .Workup in the ED showed normal CBC, BMP was normal except for sodium of 132, bicarb of 21, blood glucose 105, creatinine 1.63 (baseline creatinine 0.8-1.1) Chest x-ray showed ill-defined opacity at the left lung base, atelectasis versus pneumonia CT head without contrast showed no evidence of acute intracranial abnormality He was treated with IV ceftriaxone and azithromycin, hospitalist was asked to admit patient for further evaluation and management.  =============================================================  Principal Problem:   Syncope and collapse Active Problems:   Mixed hyperlipidemia   Essential hypertension, benign   COPD (chronic obstructive pulmonary disease) (HCC)   GERD (gastroesophageal reflux disease)   AKI (acute kidney injury) (HCC)   CAP (community acquired pneumonia)   BPH (benign prostatic hyperplasia)  Syncope and collapse Monitor closely on a monitored bed, including orthostatic vitals -Ruling out  arrhythmias, static hypotension versus vasovagal Troponins x 2 was flat at 9  -EKG: Sinus rhythm at a rate of 62 degree block  -Echocardiogram -  to rule out significant aortic stenosis or other outflow obstruction, and also to evaluate EF and to rule out segmental/Regional wall motion abnormalities.   -Carotid artery Dopplers will be done to rule out hemodynamically significant stenosis   Acute kidney injury Creatinine 1.63 (baseline creatinine 0.8-1.1) Lab Results  Component Value Date   CREATININE 1.09 02/03/2023   CREATININE 1.63 (H) 02/02/2023   CREATININE 0.94 01/22/2023    Continue gentle hydration Renally adjust medications, avoid nephrotoxic agents/dehydration/hypotension   Presumed CAP POA Chest x-ray was suggestive of atelectasis versus pneumonia -Was started on empiric antibiotic of Rocephin/azithromycin - Will switch to oral Levaquin -No leukocytosis, afebrile, Pro-Cal <0.10   Essential hypertension -Monitoring BP -orthostatic hypotension causing syncope For now restarting home medication of amlodipine, Avapro   Mixed hyperlipidemia -Mild transaminitis, we will statins for now   COPD Continue albuterol, tiotropium bromide monohydrate   GERD Continue Protonix   BPH Continue Flomax (may be contributing to vasovagal syncope_     ----------------------------------------------------------------------------------------------------------------------------- Nutritional status:  The patient's BMI is: Body mass index is 25.77 kg/m. I agree with the assessment and plan as outlined below: Nutrition Status:       ------------------------------------------------------------------------------------------------------------------------------------------------  DVT prophylaxis:  enoxaparin (LOVENOX) injection 40  mg Start: 02/03/23 1000 SCDs Start: 02/03/23 1610   Code Status:   Code Status: Full Code  Family Communication: No family member present at  bedside- -Advance care planning has been discussed.   Admission status:   Status is: Inpatient Remains inpatient appropriate because: Needing IV antibiotics, further evaluation for syncopal episodes   Disposition: From  - home             Planning for discharge in 1-2 days: to   Procedures:   No admission procedures for hospital encounter.   Antimicrobials:  Anti-infectives (From admission, onward)    Start     Dose/Rate Route Frequency Ordered Stop   02/03/23 0000  cefTRIAXone (ROCEPHIN) 1 g in sodium chloride 0.9 % 100 mL IVPB        1 g 200 mL/hr over 30 Minutes Intravenous  Once 02/02/23 2345 02/03/23 0051   02/03/23 0000  azithromycin (ZITHROMAX) 500 mg in sodium chloride 0.9 % 250 mL IVPB        500 mg 250 mL/hr over 60 Minutes Intravenous  Once 02/02/23 2345 02/03/23 0157        Medication:   enoxaparin (LOVENOX) injection  40 mg Subcutaneous Q24H    acetaminophen **OR** acetaminophen, ondansetron **OR** ondansetron (ZOFRAN) IV   Objective:   Vitals:   02/03/23 0127 02/03/23 0417 02/03/23 0823 02/03/23 0829  BP: 131/79 (!) 150/71    Pulse: (!) 57 (!) 53 (!) 50   Resp: 20 18 17    Temp: 97.9 F (36.6 C) 97.8 F (36.6 C) 98.2 F (36.8 C)   TempSrc: Oral Oral Oral   SpO2: 96% 93% 95% 100%  Weight:      Height:        Intake/Output Summary (Last 24 hours) at 02/03/2023 1146 Last data filed at 02/03/2023 9604 Gross per 24 hour  Intake 831.61 ml  Output --  Net 831.61 ml   Filed Weights   02/02/23 1819  Weight: 86.2 kg     Physical examination:   Constitution:  Alert, cooperative, no distress,  Appears calm and comfortable  Psychiatric:   Normal and stable mood and affect, cognition intact,   HEENT:        Normocephalic, PERRL, otherwise with in Normal limits  Chest:         Chest symmetric Cardio vascular:  S1/S2, RRR, No murmure, No Rubs or Gallops  pulmonary: Clear to auscultation bilaterally, respirations unlabored, negative wheezes /  crackles Abdomen: Soft, non-tender, non-distended, bowel sounds,no masses, no organomegaly Muscular skeletal: Limited exam - in bed, able to move all 4 extremities,   Neuro: CNII-XII intact. , normal motor and sensation, reflexes intact  Extremities: No pitting edema lower extremities, +2 pulses  Skin: Dry, warm to touch, negative for any Rashes, No open wounds Wounds: per nursing documentation   ------------------------------------------------------------------------------------------------------------------------------------------    LABs:     Latest Ref Rng & Units 02/03/2023    8:18 AM 02/02/2023    6:34 PM 09/19/2018    8:47 AM  CBC  WBC 4.0 - 10.5 K/uL 6.2  8.4    Hemoglobin 13.0 - 17.0 g/dL 54.0  98.1  19.1   Hematocrit 39.0 - 52.0 % 43.7  44.0  48.0   Platelets 150 - 400 K/uL 283  314        Latest Ref Rng & Units 02/03/2023    8:18 AM 02/02/2023    6:34 PM 01/22/2023    9:37 AM  CMP  Glucose 70 - 99  mg/dL 960  454  92   BUN 8 - 23 mg/dL 18  23  12    Creatinine 0.61 - 1.24 mg/dL 0.98  1.19  1.47   Sodium 135 - 145 mmol/L 134  132  134   Potassium 3.5 - 5.1 mmol/L 4.2  4.0  3.6   Chloride 98 - 111 mmol/L 103  101  105   CO2 22 - 32 mmol/L 24  21  23    Calcium 8.9 - 10.3 mg/dL 9.0  9.0  8.8   Total Protein 6.5 - 8.1 g/dL 8.2     Total Bilirubin 0.0 - 1.2 mg/dL 0.9     Alkaline Phos 38 - 126 U/L 86     AST 15 - 41 U/L 91     ALT 0 - 44 U/L 93          Micro Results No results found for this or any previous visit (from the past 240 hours).  Radiology Reports DG Chest Portable 1 View Result Date: 02/02/2023 CLINICAL DATA:  Shortness of breath.  History of COPD. EXAM: PORTABLE CHEST 1 VIEW COMPARISON:  CT 10/08/2022 FINDINGS: The heart is upper normal in size, likely accentuated by portable technique. Normal mediastinal contours. Chronic hyperinflation. Moderate bronchial thickening. There are ill-defined opacities at the left lung base. No pulmonary edema. No pleural  effusion. Remote left rib fracture. IMPRESSION: 1. Chronic hyperinflation and bronchial thickening. 2. Ill-defined opacities at the left lung base, atelectasis versus pneumonia. Electronically Signed   By: Narda Rutherford M.D.   On: 02/02/2023 22:57   CT Head Wo Contrast Result Date: 02/02/2023 CLINICAL DATA:  Syncope. EXAM: CT HEAD WITHOUT CONTRAST TECHNIQUE: Contiguous axial images were obtained from the base of the skull through the vertex without intravenous contrast. RADIATION DOSE REDUCTION: This exam was performed according to the departmental dose-optimization program which includes automated exposure control, adjustment of the mA and/or kV according to patient size and/or use of iterative reconstruction technique. COMPARISON:  CT head 07/06/2008 FINDINGS: Brain: No evidence of acute large vascular territory infarction, hemorrhage, hydrocephalus, extra-axial collection or mass lesion/mass effect. 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 Vascular: No hyperdense vessel.  Calcific atherosclerosis. Skull: No acute fracture. Sinuses/Orbits: Clear sinuses.  No acute orbital findings. Other: No mastoid effusions. IMPRESSION: 1. No evidence of acute intracranial abnormality. 2. Remote medial left temporal infarct. 3. Remote bilateral thalamic lacunar infarcts. Electronically Signed   By: Feliberto Harts M.D.   On: 02/02/2023 22:54    SIGNED: Kendell Bane, MD, FHM. FAAFP. [facility] - [facility] Time spent - 55 min.  In seeing, evaluating and examining the patient. Reviewing medical records, labs, drawn plan of care. [facility],  Pager (please use [email or web address] to page/ text) Please use Epic Secure Chat for non-urgent communication (7AM-7PM)  If 7PM-7AM, please contact night-coverage [email or web address], 02/03/2023, 11:46 AM

## 2023-02-03 NOTE — Hospital Course (Addendum)
Mark Waller is a 63 y.o. male with medical history significant of hypertension, hyperlipidemia, GERD, COPD who presents to the emergency department due to recurrent syncopal episodes within the last 4 days.  Patient says that he has had about 3 episodes in which he passes out and within few minutes regained consciousness without any period of confusion after recovering from the syncope.  He denies hitting his head or sustaining any injury.   ED Course:  Found hemodynamically stable, .Workup in the ED showed normal CBC, BMP was normal except for sodium of 132, bicarb of 21, blood glucose 105, creatinine 1.63 (baseline creatinine 0.8-1.1) Chest x-ray showed ill-defined opacity at the left lung base, atelectasis versus pneumonia CT head without contrast showed no evidence of acute intracranial abnormality He was treated with IV ceftriaxone and azithromycin, hospitalist was asked to admit patient for further evaluation and management.  =============================================================  Principal Problem:   Syncope and collapse Active Problems:   Mixed hyperlipidemia   Essential hypertension, benign   COPD (chronic obstructive pulmonary disease) (HCC)   GERD (gastroesophageal reflux disease)   AKI (acute kidney injury) (HCC)   CAP (community acquired pneumonia)   BPH (benign prostatic hyperplasia)  Syncope and collapse Monitor closely on a monitored bed, including orthostatic vitals -Ruling out arrhythmias, static hypotension versus vasovagal Troponins x 2 was flat at 9  -EKG: Sinus rhythm at a rate of 62 degree block  -Echocardiogram -  to rule out significant aortic stenosis or other outflow obstruction, and also to evaluate EF and to rule out segmental/Regional wall motion abnormalities.   -Carotid artery Dopplers will be done to rule out hemodynamically significant stenosis   Acute kidney injury Creatinine 1.63 (baseline creatinine 0.8-1.1) Lab Results  Component Value  Date   CREATININE 1.09 02/03/2023   CREATININE 1.63 (H) 02/02/2023   CREATININE 0.94 01/22/2023    Continue gentle hydration Renally adjust medications, avoid nephrotoxic agents/dehydration/hypotension   Presumed CAP POA Chest x-ray was suggestive of atelectasis versus pneumonia -Was started on empiric antibiotic of Rocephin/azithromycin - Will switch to oral Levaquin -No leukocytosis, afebrile, Pro-Cal <0.10   Essential hypertension -Monitoring BP -orthostatic hypotension causing syncope For now restarting home medication of amlodipine, Avapro   Mixed hyperlipidemia -Mild transaminitis, we will statins for now   COPD Continue albuterol, tiotropium bromide monohydrate   GERD Continue Protonix   BPH Continue Flomax (may be contributing to vasovagal syncope_

## 2023-02-04 DIAGNOSIS — R55 Syncope and collapse: Secondary | ICD-10-CM | POA: Diagnosis not present

## 2023-02-04 LAB — COMPREHENSIVE METABOLIC PANEL
ALT: 96 U/L — ABNORMAL HIGH (ref 0–44)
AST: 93 U/L — ABNORMAL HIGH (ref 15–41)
Albumin: 3.5 g/dL (ref 3.5–5.0)
Alkaline Phosphatase: 83 U/L (ref 38–126)
Anion gap: 7 (ref 5–15)
BUN: 16 mg/dL (ref 8–23)
CO2: 22 mmol/L (ref 22–32)
Calcium: 9 mg/dL (ref 8.9–10.3)
Chloride: 107 mmol/L (ref 98–111)
Creatinine, Ser: 0.96 mg/dL (ref 0.61–1.24)
GFR, Estimated: >60 mL/min (ref >60)
Glucose, Bld: 114 mg/dL — ABNORMAL HIGH (ref 70–99)
Potassium: 4.2 mmol/L (ref 3.5–5.1)
Sodium: 136 mmol/L (ref 135–145)
Total Bilirubin: 0.7 mg/dL (ref 0.0–1.2)
Total Protein: 8.1 g/dL (ref 6.5–8.1)

## 2023-02-04 LAB — HIV ANTIBODY (ROUTINE TESTING W REFLEX): HIV Screen 4th Generation wRfx: NONREACTIVE

## 2023-02-04 MED ORDER — LACTATED RINGERS IV BOLUS
500.0000 mL | Freq: Once | INTRAVENOUS | Status: AC
  Administered 2023-02-04: 500 mL via INTRAVENOUS

## 2023-02-04 MED ORDER — IRBESARTAN 75 MG PO TABS: 75.0000 mg | ORAL_TABLET | Freq: Every day | ORAL | Status: DC

## 2023-02-04 MED ORDER — MELATONIN 3 MG PO TABS
6.0000 mg | ORAL_TABLET | Freq: Every evening | ORAL | Status: DC | PRN
  Administered 2023-02-04: 6 mg via ORAL
  Filled 2023-02-04: qty 2

## 2023-02-04 NOTE — TOC Transition Note (Signed)
Transition of Care [facility]) - Discharge Note   Patient Details  Name: Mark Waller MRN: [id number] Date of Birth: 04/22/1960  Transition of Care [facility]) CM/SW Contact:  Villa Herb, LCSWA Phone Number: 02/04/2023, 10:47 AM   Clinical Narrative:    CSW updated that pt may D/C home today. CSW requested that MD place [facility] PT/OT orders. CSW spoke to [place] with [facility] who confirms they can accept [facility] referral. CSW updated that pt will likely D/C home today. TOC signing off.   Final next level of care: Home w Home Health Services Barriers to Discharge: Barriers Resolved   Patient Goals and CMS Choice Patient states their goals for this hospitalization and ongoing recovery are:: return home CMS Medicare.gov Compare Post Acute Care list provided to:: Patient Choice offered to / list presented to : Patient      Discharge Placement                       Discharge Plan and Services Additional resources added to the After Visit Summary for   In-house Referral: Clinical Social Work Discharge Planning Services: CM Consult                      HH Arranged: PT, OT HH Agency: [facility] Date [facility] Agency Contacted: 02/04/23   Representative spoke with at [facility] Agency: Kandee Keen  Social Drivers of Health (SDOH) Interventions SDOH Screenings   Food Insecurity: No Food Insecurity (02/03/2023)  Housing: Patient Declined (02/03/2023)  Transportation Needs: Patient Declined (02/03/2023)  Utilities: Patient Declined (02/03/2023)  Financial Resource Strain: Low Risk  (10/30/2022)   Received from [facility] [place]  Physical Activity: Inactive (10/30/2022)   Received from [facility]  Social Connections: Unknown (10/30/2022)   Received from [facility]  Stress: No Stress Concern Present (11/26/2022)   Received [facility]  Tobacco Use: High Risk (02/02/2023)  Health Literacy: Low Risk  (10/30/2022)   Received from [facility]     Readmission Risk Interventions      No data to display
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
## 2023-02-04 NOTE — Plan of Care (Signed)
  Problem: Education: Goal: Knowledge of General Education information will improve Description: Including pain rating scale, medication(s)/side effects and non-pharmacologic comfort measures Outcome: Progressing   Problem: Clinical Measurements: Goal: Ability to maintain clinical measurements within normal limits will improve Outcome: Progressing   Problem: Health Behavior/Discharge Planning: Goal: Ability to manage health-related needs will improve Outcome: Progressing   Problem: Activity: Goal: Risk for activity intolerance will decrease Outcome: Progressing   Problem: Coping: Goal: Level of anxiety will decrease Outcome: Progressing   Problem: Safety: Goal: Ability to remain free from injury will improve Outcome: Progressing

## 2023-02-04 NOTE — Progress Notes (Signed)
Mobility Specialist Progress Note:    02/04/23 1405  Mobility  Activity Dangled on edge of bed;Ambulated with assistance in hallway  Level of Assistance Independent  Assistive Device None  Distance Ambulated (ft) 200 ft  Range of Motion/Exercises Active;All extremities  Activity Response Tolerated well  Mobility Referral Yes  Mobility visit 1 Mobility  Mobility Specialist Start Time (ACUTE ONLY) 1405  Mobility Specialist Stop Time (ACUTE ONLY) 1425  Mobility Specialist Time Calculation (min) (ACUTE ONLY) 20 min   Pt received sitting EOB, agreeable to mobility. Independently able to stand and ambulate with no AD. Tolerated well, see BP below. Pt denies dizziness. Returned pt sitting EOB, all needs met.   BP before ambulation: 109/68 BP after ambulation: 121/67  Lawerance Bach Mobility Specialist Please contact via SecureChat or  Rehab office at [phone]

## 2023-02-05 DIAGNOSIS — R55 Syncope and collapse: Secondary | ICD-10-CM | POA: Diagnosis not present

## 2023-02-05 LAB — COMPREHENSIVE METABOLIC PANEL
ALT: 90 U/L — ABNORMAL HIGH (ref 0–44)
AST: 81 U/L — ABNORMAL HIGH (ref 15–41)
Albumin: 3.5 g/dL (ref 3.5–5.0)
Alkaline Phosphatase: 81 U/L (ref 38–126)
Anion gap: 5 (ref 5–15)
BUN: 16 mg/dL (ref 8–23)
CO2: 25 mmol/L (ref 22–32)
Calcium: 9.2 mg/dL (ref 8.9–10.3)
Chloride: 106 mmol/L (ref 98–111)
Creatinine, Ser: 0.97 mg/dL (ref 0.61–1.24)
GFR, Estimated: >60 mL/min (ref >60)
Glucose, Bld: 118 mg/dL — ABNORMAL HIGH (ref 70–99)
Potassium: 3.9 mmol/L (ref 3.5–5.1)
Sodium: 136 mmol/L (ref 135–145)
Total Bilirubin: 0.6 mg/dL (ref 0.0–1.2)
Total Protein: 8 g/dL (ref 6.5–8.1)

## 2023-02-05 MED ORDER — AMLODIPINE BESYLATE 5 MG PO TABS: 2.5000 mg | ORAL_TABLET | Freq: Every day | ORAL | 0 refills | Status: AC

## 2023-02-05 MED ORDER — AMLODIPINE BESYLATE 5 MG PO TABS: 2.5000 mg | ORAL_TABLET | Freq: Every day | ORAL | 0 refills | Status: DC

## 2023-02-05 NOTE — Discharge Summary (Signed)
Physician Discharge Summary   Patient: Mark Waller MRN: [id number] DOB: [birth year]  Admit date:     02/02/2023  Discharge date: 02/05/23  Discharge Physician: Kendell Bane   PCP: [facility]   Recommendations at discharge:   Follow-up with PCP in 1 week Follow-up with cardiology as an outpatient for further evaluation for recurrent syncopal episodes Due to hypotension on this admission BP meds has been modified-once symptoms resolved medication needs to be readjusted for BP control  Discharge Diagnoses: Principal Problem:   Syncope and collapse Active Problems:   Mixed hyperlipidemia   Essential hypertension, benign   COPD (chronic obstructive pulmonary disease) (HCC)   GERD (gastroesophageal reflux disease)   AKI (acute kidney injury) (HCC)   CAP (community acquired pneumonia)   BPH (benign prostatic hyperplasia)  Resolved Problems:   * No resolved hospital problems. *  Hospital Course:  Mark Waller is a 63 y.o. male with medical history significant of hypertension, hyperlipidemia, GERD, COPD who presents to the emergency department due to recurrent syncopal episodes within the last 4 days.  Patient says that he has had about 3 episodes in which he passes out and within few minutes regained consciousness without any period of confusion after recovering from the syncope.  He denies hitting his head or sustaining any injury.   ED Course:  Found hemodynamically stable, .Workup in the ED showed normal CBC, BMP was normal except for sodium of 132, bicarb of 21, blood glucose 105, creatinine 1.63 (baseline creatinine 0.8-1.1) Chest x-ray showed ill-defined opacity at the left lung base, atelectasis versus pneumonia CT head without contrast showed no evidence of acute intracranial abnormality He was treated with IV ceftriaxone and azithromycin, hospitalist was asked to admit patient for further evaluation and  management.  =============================================================  Syncope and collapse Monitor closely on a monitored bed, including orthostatic vitals, dehydration -Ruling out arrhythmias,  versus vasovagal Troponins x 2 was flat at 9  -EKG: Sinus rhythm at a rate of 62 degree block  -Echocardiogram -  to rule out significant aortic stenosis or other outflow obstruction, and also to evaluate EF and to rule out segmental/Regional wall motion abnormalities.   -Carotid artery Dopplers will be done to rule out hemodynamically significant stenosis   Acute kidney injury Creatinine 1.63 (baseline creatinine 0.8-1.1) Improved back to baseline Lab Results  Component Value Date   CREATININE 0.97 02/05/2023   CREATININE 0.96 02/04/2023   CREATININE 1.09 02/03/2023    Continue gentle hydration Renally adjust medications, avoid nephrotoxic agents/dehydration/hypotension   Presumed CAP POA Chest x-ray was suggestive of atelectasis versus pneumonia -Was started on empiric antibiotic of Rocephin/azithromycin - switch to oral Levaquin -No leukocytosis, afebrile, Pro-Cal <0.10   Essential hypertension -Monitoring BP -orthostatic hypotension causing syncope For now restarting home medication of amlodipine, Avapro   Mixed hyperlipidemia -Mild transaminitis, we will statins for now   COPD Continue albuterol, tiotropium bromide monohydrate   GERD Continue Protonix   BPH Continue Flomax (may be contributing to vasovagal syncope_   Consultants: Cardiology Disposition: Home Diet recommendation:  Discharge Diet Orders (From admission, onward)     Start     Ordered   02/05/23 0000  Diet - low sodium heart healthy        02/05/23 1153           Cardiac diet DISCHARGE MEDICATION: Allergies as of 02/05/2023       Reactions   Levofloxacin Swelling   Aspirin Other (See Comments)   "per  pt was told to not take because blood to thin" Told not to take due to previous  strokes in past   Penicillins Rash   Codeine Itching   Hydrocodone Nausea Only   Tramadol Itching, Rash        Medication List     STOP taking these medications    olmesartan 20 MG tablet Commonly known as: Benicar       TAKE these medications    albuterol 108 (90 Base) MCG/ACT inhaler Commonly known as: VENTOLIN HFA Inhale 2 puffs into the lungs every 6 (six) hours as needed.   amLODipine 5 MG tablet Commonly known as: NORVASC Take 0.5 tablets (2.5 mg total) by mouth daily. What changed: how much to take   fluticasone 50 MCG/ACT nasal spray Commonly known as: FLONASE Place 2 sprays into both nostrils daily.   nitroGLYCERIN 0.3 MG SL tablet Commonly known as: NITROSTAT Place 0.3 mg under the tongue every 5 (five) minutes as needed.   omeprazole 40 MG capsule Commonly known as: PRILOSEC Take 40 mg by mouth daily.   rosuvastatin 40 MG tablet Commonly known as: CRESTOR Take 40 mg by mouth daily.   sildenafil 100 MG tablet Commonly known as: VIAGRA You can either take 1 tab po daily prn or 1/2 tab po daily prn with 10mg  of tadalafil.   Do not take your nitroglycerin if you use these.   Spiriva Respimat 2.5 MCG/ACT Aers Generic drug: Tiotropium Bromide Monohydrate Inhale 2 puffs into the lungs daily.   tamsulosin 0.4 MG Caps capsule Commonly known as: FLOMAX Take 1 capsule (0.4 mg total) by mouth daily.        Follow-up Information     [facility] Follow up.   Specialty: Home Health Services Why: Agency will call to set up home visits. Contact information: [address] [place] [address] [phone]                Discharge Exam: Ceasar Mons Weights   02/02/23 1819  Weight: 86.2 kg        General:  AAO x 3,  cooperative, no distress;   HEENT:  Normocephalic, PERRL, otherwise with in Normal limits   Neuro:  CNII-XII intact. , normal motor and sensation, reflexes intact   Lungs:   Clear to auscultation BL,  Respirations unlabored,  No wheezes / crackles  Cardio:    S1/S2, RRR, No murmure, No Rubs or Gallops   Abdomen:  Soft, non-tender, bowel sounds active all four quadrants, no guarding or peritoneal signs.  Muscular  skeletal:  Limited exam -global generalized weaknesses - in bed, able to move all 4 extremities,   2+ pulses,  symmetric, No pitting edema  Skin:  Dry, warm to touch, negative for any Rashes,  Wounds: Please see nursing documentation          Condition at discharge: good  The results of significant diagnostics from this hospitalization (including imaging, microbiology, ancillary and laboratory) are listed below for reference.   Imaging Studies: US Carotid Bilateral Result Date: 02/04/2023 CLINICAL DATA:  63 year old male with a history of syncope EXAM: BILATERAL CAROTID DUPLEX ULTRASOUND TECHNIQUE: Wallace Cullens scale imaging, color Doppler and duplex ultrasound were performed of bilateral carotid and vertebral arteries in the neck. COMPARISON:  None Available. FINDINGS: Criteria: Quantification of carotid stenosis is based on velocity parameters that correlate the residual internal carotid diameter with NASCET-based stenosis levels, using the diameter of the distal internal carotid lumen as the denominator for  stenosis measurement. The following velocity measurements were obtained: RIGHT ICA:  Systolic 98 cm/sec, Diastolic 27 cm/sec CCA:  95 cm/sec SYSTOLIC ICA/CCA RATIO:  1.5 ECA:  131 cm/sec LEFT ICA:  Systolic 78 cm/sec, Diastolic 20 cm/sec CCA:  84 cm/sec SYSTOLIC ICA/CCA RATIO:  1.1 ECA:  124 cm/sec Right Brachial SBP: Not acquired Left Brachial SBP: Not acquired RIGHT CAROTID ARTERY: No significant calcified disease of the right common carotid artery. Intermediate waveform maintained. Heterogeneous plaque without significant calcifications at the right carotid bifurcation. Low resistance waveform of the right ICA. No significant tortuosity. RIGHT VERTEBRAL ARTERY: Antegrade flow with  low resistance waveform. LEFT CAROTID ARTERY: No significant calcified disease of the left common carotid artery. Intermediate waveform maintained. Heterogeneous plaque at the left carotid bifurcation without significant calcifications. Low resistance waveform of the left ICA. LEFT VERTEBRAL ARTERY:  Antegrade flow with low resistance waveform. IMPRESSION: Color duplex indicates minimal heterogeneous plaque, with no hemodynamically significant stenosis by duplex criteria in the extracranial cerebrovascular circulation. Signed, Yvone Neu. Miachel Roux, RPVI [facility] Electronically Signed   By: Gilmer Mor D.O.   On: 02/04/2023 07:43   ECHOCARDIOGRAM COMPLETE Result Date: 02/03/2023    ECHOCARDIOGRAM REPORT   Patient Name:   Mark Waller Date of Exam: 02/03/2023 Medical Rec #:  [id number]     Height:       72.0 in Accession #:    [id number]    Weight:       190.0 lb Date of Birth:  Apr 10, 1960      BSA:          2.085 m Patient Age:    62 years      BP:           110/61 mmHg Patient Gender: M             HR:           48 bpm. [facility] Procedure: 2D Echo, Cardiac Doppler and Color Doppler Indications:    Syncope R55  History:        Patient has prior history of Echocardiogram examinations, most                 recent 12/17/2010. PAD, COPD, Stroke and ETOH abuse,                 Arrythmias:Atrial Fibrillation, Signs/Symptoms:Syncope; Risk                 Factors:Hypertension, Dyslipidemia and Sleep Apnea.  Sonographer:    Dondra Prader RVT RCS Referring Phys: [id number] OLADAPO ADEFESO  Sonographer Comments: Image acquisition challenging due to respiratory motion. IMPRESSIONS  1. Left ventricular ejection fraction, by estimation, is 60 to 65%. The left ventricle has normal function. The left ventricle has no regional wall motion abnormalities. There is mild left ventricular hypertrophy. Left ventricular diastolic parameters are indeterminate.   2. Right ventricular systolic function is normal. The right ventricular size is normal. Tricuspid regurgitation signal is inadequate for assessing PA pressure.  3. The mitral valve is normal in structure. No evidence of mitral valve regurgitation. No evidence of mitral stenosis.  4. The aortic valve is tricuspid. There is mild calcification of the aortic valve. There is mild thickening of the aortic valve. Aortic valve regurgitation is mild. No aortic stenosis is present.  5. The inferior vena cava is normal in size with greater than 50% respiratory variability, suggesting right atrial pressure of 3 mmHg.  FINDINGS  Left Ventricle: Left ventricular ejection fraction, by estimation, is 60 to 65%. The left ventricle has normal function. The left ventricle has no regional wall motion abnormalities. The left ventricular internal cavity size was normal in size. There is  mild left ventricular hypertrophy. Left ventricular diastolic parameters are indeterminate. Right Ventricle: The right ventricular size is normal. Right vetricular wall thickness was not well visualized. Right ventricular systolic function is normal. Tricuspid regurgitation signal is inadequate for assessing PA pressure. Left Atrium: Left atrial size was normal in size. Right Atrium: Right atrial size was normal in size. Pericardium: There is no evidence of pericardial effusion. Mitral Valve: The mitral valve is normal in structure. No evidence of mitral valve regurgitation. No evidence of mitral valve stenosis. Tricuspid Valve: The tricuspid valve is not well visualized. Tricuspid valve regurgitation is not demonstrated. No evidence of tricuspid stenosis. Aortic Valve: The aortic valve is tricuspid. There is mild calcification of the aortic valve. There is mild thickening of the aortic valve. There is mild aortic valve annular calcification. Aortic valve regurgitation is mild. No aortic stenosis is present. Aortic valve mean gradient measures 4.7 mmHg.  Aortic valve peak gradient measures 9.2 mmHg. Aortic valve area, by VTI measures 2.12 cm. Pulmonic Valve: The pulmonic valve was not well visualized. Pulmonic valve regurgitation is not visualized. No evidence of pulmonic stenosis. Aorta: The aortic root and ascending aorta are structurally normal, with no evidence of dilitation. Venous: The inferior vena cava is normal in size with greater than 50% respiratory variability, suggesting right atrial pressure of 3 mmHg. IAS/Shunts: No atrial level shunt detected by color flow Doppler.  LEFT VENTRICLE PLAX 2D LVIDd:         5.00 cm   Diastology LVIDs:         2.60 cm   LV e' medial:   8.78 cm/s LV PW:         1.20 cm   LV E/e' medial: 7.4 LV IVS:        1.10 cm LVOT diam:     2.20 cm LV SV:         70 LV SV Index:   34 LVOT Area:     3.80 cm  RIGHT VENTRICLE             IVC RV S prime:     13.70 cm/s  IVC diam: 1.50 cm LEFT ATRIUM             Index LA diam:        4.50 cm 2.16 cm/m LA Vol (A2C):   56.0 ml 26.86 ml/m LA Vol (A4C):   50.5 ml 24.23 ml/m LA Biplane Vol: 54.1 ml 25.95 ml/m  AORTIC VALVE                    PULMONIC VALVE AV Area (Vmax):    1.87 cm     PV Vmax:       0.73 m/s AV Area (Vmean):   1.84 cm     PV Peak grad:  2.1 mmHg AV Area (VTI):     2.12 cm AV Vmax:           151.33 cm/s AV Vmean:          99.000 cm/s AV VTI:            0.332 m AV Peak Grad:      9.2 mmHg AV Mean Grad:      4.7 mmHg LVOT Vmax:  74.35 cm/s LVOT Vmean:        48.000 cm/s LVOT VTI:          0.185 m LVOT/AV VTI ratio: 0.56 AR Vena Contracta: 0.60 cm  AORTA Ao Root diam: 3.30 cm Ao Asc diam:  3.30 cm MITRAL VALVE MV Area (PHT): 3.34 cm    SHUNTS MV Decel Time: 227 msec    Systemic VTI:  0.18 m MV E velocity: 64.70 cm/s  Systemic Diam: 2.20 cm MV A velocity: 69.60 cm/s MV E/A ratio:  0.93 Dina Rich MD Electronically signed by Dina Rich MD Signature Date/Time: 02/03/2023/3:55:48 PM    Final    DG Chest Portable 1 View Result Date: 02/02/2023 CLINICAL  DATA:  Shortness of breath.  History of COPD. EXAM: PORTABLE CHEST 1 VIEW COMPARISON:  CT 10/08/2022 FINDINGS: The heart is upper normal in size, likely accentuated by portable technique. Normal mediastinal contours. Chronic hyperinflation. Moderate bronchial thickening. There are ill-defined opacities at the left lung base. No pulmonary edema. No pleural effusion. Remote left rib fracture. IMPRESSION: 1. Chronic hyperinflation and bronchial thickening. 2. Ill-defined opacities at the left lung base, atelectasis versus pneumonia. Electronically Signed   By: Narda Rutherford M.D.   On: 02/02/2023 22:57   CT Head Wo Contrast Result Date: 02/02/2023 CLINICAL DATA:  Syncope. EXAM: CT HEAD WITHOUT CONTRAST TECHNIQUE: Contiguous axial images were obtained from the base of the skull through the vertex without intravenous contrast. RADIATION DOSE REDUCTION: This exam was performed according to the departmental dose-optimization program which includes automated exposure control, adjustment of the mA and/or kV according to patient size and/or use of iterative reconstruction technique. COMPARISON:  CT head 07/06/2008 FINDINGS: Brain: No evidence of acute large vascular territory infarction, hemorrhage, hydrocephalus, extra-axial collection or mass lesion/mass effect. 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 Vascular: No hyperdense vessel.  Calcific atherosclerosis. Skull: No acute fracture. Sinuses/Orbits: Clear sinuses.  No acute orbital findings. Other: No mastoid effusions. IMPRESSION: 1. No evidence of acute intracranial abnormality. 2. Remote medial left temporal infarct. 3. Remote bilateral thalamic lacunar infarcts. Electronically Signed   By: Feliberto Harts M.D.   On: 02/02/2023 22:54    Microbiology: Results for orders placed or performed in visit on 12/17/22  Microscopic Examination     Status: Abnormal   Collection Time: 12/17/22 10:33 AM   Urine   Result Value Ref Range Status   WBC, UA 0-5 0 - 5 /hpf Final   RBC, Urine 0-2 0 - 2 /hpf Final   Epithelial Cells (non renal) 0-10 0 - 10 /hpf Final   Casts Present (A) None seen /lpf Final   Cast Type Granular casts (A) N/A Final   Bacteria, UA None seen None seen/Few Final    Labs: CBC: Recent Labs  Lab 02/02/23 1834 02/03/23 0818  WBC 8.4 6.2  HGB 14.5 14.3  HCT 44.0 43.7  MCV 88.5 89.5  PLT 314 283   Basic Metabolic Panel: Recent Labs  Lab 02/02/23 1834 02/03/23 0818 02/04/23 0504 02/05/23 0509  NA 132* 134* 136 136  K 4.0 4.2 4.2 3.9  CL 101 103 107 106  CO2 21* 24 22 25   GLUCOSE 105* 112* 114* 118*  BUN 23 18 16 16   CREATININE 1.63* 1.09 0.96 0.97  CALCIUM 9.0 9.0 9.0 9.2  MG  --  2.2  --   --   PHOS  --  3.1  --   --    Liver Function  Tests: Recent Labs  Lab 02/03/23 0818 02/04/23 0504 02/05/23 0509  AST 91* 93* 81*  ALT 93* 96* 90*  ALKPHOS 86 83 81  BILITOT 0.9 0.7 0.6  PROT 8.2* 8.1 8.0  ALBUMIN 3.6 3.5 3.5   CBG: Recent Labs  Lab 02/02/23 1825  GLUCAP 106*    Discharge time spent: greater than 30 minutes.  Signed: Kendell Bane, MD [facility] 02/05/2023

## 2023-02-05 NOTE — Plan of Care (Signed)
  Problem: Education: Goal: Knowledge of General Education information will improve Description: Including pain rating scale, medication(s)/side effects and non-pharmacologic comfort measures Outcome: Progressing   Problem: Clinical Measurements: Goal: Ability to maintain clinical measurements within normal limits will improve Outcome: Progressing   Problem: Activity: Goal: Risk for activity intolerance will decrease Outcome: Progressing   Problem: Coping: Goal: Level of anxiety will decrease Outcome: Progressing   Problem: Safety: Goal: Ability to remain free from injury will improve Outcome: Progressing   

## 2023-02-05 NOTE — Plan of Care (Signed)
Patient ID: DYAMI UMBACH, male   DOB: [birth year], 63 y.o.   MRN: [id number]  Problem: Education: Goal: Knowledge of General Education information will improve Description: Including pain rating scale, medication(s)/side effects and non-pharmacologic comfort measures Outcome: Adequate for Discharge   Problem: Health Behavior/Discharge Planning: Goal: Ability to manage health-related needs will improve Outcome: Adequate for Discharge   Problem: Clinical Measurements: Goal: Ability to maintain clinical measurements within normal limits will improve Outcome: Adequate for Discharge Goal: Will remain free from infection Outcome: Adequate for Discharge Goal: Diagnostic test results will improve Outcome: Adequate for Discharge Goal: Respiratory complications will improve Outcome: Adequate for Discharge Goal: Cardiovascular complication will be avoided Outcome: Adequate for Discharge   Problem: Activity: Goal: Risk for activity intolerance will decrease Outcome: Adequate for Discharge   Problem: Nutrition: Goal: Adequate nutrition will be maintained Outcome: Adequate for Discharge   Problem: Coping: Goal: Level of anxiety will decrease Outcome: Adequate for Discharge   Problem: Elimination: Goal: Will not experience complications related to bowel motility Outcome: Adequate for Discharge Goal: Will not experience complications related to urinary retention Outcome: Adequate for Discharge   Problem: Pain Managment: Goal: General experience of comfort will improve and/or be controlled Outcome: Adequate for Discharge   Problem: Safety: Goal: Ability to remain free from injury will improve Outcome: Adequate for Discharge   Problem: Skin Integrity: Goal: Risk for impaired skin integrity will decrease Outcome: Adequate for Discharge  Lidia Collum, RN

## 2023-02-05 NOTE — Care Management Important Message (Signed)
Important Message  Patient Details  Name: Mark Waller MRN: [id number] Date of Birth: 1960/06/08   Important Message Given:  Yes - Medicare IM     Corey Harold 02/05/2023, 12:06 PM

## 2023-02-19 DIAGNOSIS — I119 Hypertensive heart disease without heart failure: Secondary | ICD-10-CM | POA: Diagnosis not present

## 2023-02-19 DIAGNOSIS — I082 Rheumatic disorders of both aortic and tricuspid valves: Secondary | ICD-10-CM | POA: Diagnosis not present

## 2023-02-19 DIAGNOSIS — I10 Essential (primary) hypertension: Secondary | ICD-10-CM | POA: Diagnosis not present

## 2023-02-19 DIAGNOSIS — Z8551 Personal history of malignant neoplasm of bladder: Secondary | ICD-10-CM | POA: Diagnosis not present

## 2023-02-19 DIAGNOSIS — I739 Peripheral vascular disease, unspecified: Secondary | ICD-10-CM | POA: Diagnosis not present

## 2023-02-19 DIAGNOSIS — I440 Atrioventricular block, first degree: Secondary | ICD-10-CM | POA: Diagnosis not present

## 2023-02-19 DIAGNOSIS — F17210 Nicotine dependence, cigarettes, uncomplicated: Secondary | ICD-10-CM | POA: Diagnosis not present

## 2023-02-19 DIAGNOSIS — I2510 Atherosclerotic heart disease of native coronary artery without angina pectoris: Secondary | ICD-10-CM | POA: Diagnosis not present

## 2023-02-19 DIAGNOSIS — K219 Gastro-esophageal reflux disease without esophagitis: Secondary | ICD-10-CM | POA: Diagnosis not present

## 2023-02-19 DIAGNOSIS — I4891 Unspecified atrial fibrillation: Secondary | ICD-10-CM | POA: Diagnosis not present

## 2023-02-19 DIAGNOSIS — Z9181 History of falling: Secondary | ICD-10-CM | POA: Diagnosis not present

## 2023-02-19 DIAGNOSIS — E782 Mixed hyperlipidemia: Secondary | ICD-10-CM | POA: Diagnosis not present

## 2023-02-19 DIAGNOSIS — Z79899 Other long term (current) drug therapy: Secondary | ICD-10-CM | POA: Diagnosis not present

## 2023-02-19 DIAGNOSIS — Z8673 Personal history of transient ischemic attack (TIA), and cerebral infarction without residual deficits: Secondary | ICD-10-CM | POA: Diagnosis not present

## 2023-02-19 DIAGNOSIS — J449 Chronic obstructive pulmonary disease, unspecified: Secondary | ICD-10-CM | POA: Diagnosis not present

## 2023-02-19 DIAGNOSIS — N179 Acute kidney failure, unspecified: Secondary | ICD-10-CM | POA: Diagnosis not present

## 2023-02-22 DIAGNOSIS — I119 Hypertensive heart disease without heart failure: Secondary | ICD-10-CM | POA: Diagnosis not present

## 2023-02-22 DIAGNOSIS — Z9181 History of falling: Secondary | ICD-10-CM | POA: Diagnosis not present

## 2023-02-22 DIAGNOSIS — I440 Atrioventricular block, first degree: Secondary | ICD-10-CM | POA: Diagnosis not present

## 2023-02-22 DIAGNOSIS — E782 Mixed hyperlipidemia: Secondary | ICD-10-CM | POA: Diagnosis not present

## 2023-02-22 DIAGNOSIS — I082 Rheumatic disorders of both aortic and tricuspid valves: Secondary | ICD-10-CM | POA: Diagnosis not present

## 2023-02-22 DIAGNOSIS — K219 Gastro-esophageal reflux disease without esophagitis: Secondary | ICD-10-CM | POA: Diagnosis not present

## 2023-02-22 DIAGNOSIS — N179 Acute kidney failure, unspecified: Secondary | ICD-10-CM | POA: Diagnosis not present

## 2023-02-22 DIAGNOSIS — I10 Essential (primary) hypertension: Secondary | ICD-10-CM | POA: Diagnosis not present

## 2023-02-22 DIAGNOSIS — F17210 Nicotine dependence, cigarettes, uncomplicated: Secondary | ICD-10-CM | POA: Diagnosis not present

## 2023-02-22 DIAGNOSIS — Z09 Encounter for follow-up examination after completed treatment for conditions other than malignant neoplasm: Secondary | ICD-10-CM | POA: Diagnosis not present

## 2023-02-22 DIAGNOSIS — J449 Chronic obstructive pulmonary disease, unspecified: Secondary | ICD-10-CM | POA: Diagnosis not present

## 2023-02-22 DIAGNOSIS — I2510 Atherosclerotic heart disease of native coronary artery without angina pectoris: Secondary | ICD-10-CM | POA: Diagnosis not present

## 2023-02-22 DIAGNOSIS — M2559 Pain in other specified joint: Secondary | ICD-10-CM | POA: Diagnosis not present

## 2023-02-22 DIAGNOSIS — Z8673 Personal history of transient ischemic attack (TIA), and cerebral infarction without residual deficits: Secondary | ICD-10-CM | POA: Diagnosis not present

## 2023-02-22 DIAGNOSIS — I959 Hypotension, unspecified: Secondary | ICD-10-CM | POA: Diagnosis not present

## 2023-02-22 DIAGNOSIS — I739 Peripheral vascular disease, unspecified: Secondary | ICD-10-CM | POA: Diagnosis not present

## 2023-02-22 DIAGNOSIS — Z79899 Other long term (current) drug therapy: Secondary | ICD-10-CM | POA: Diagnosis not present

## 2023-02-22 DIAGNOSIS — Z8551 Personal history of malignant neoplasm of bladder: Secondary | ICD-10-CM | POA: Diagnosis not present

## 2023-02-22 DIAGNOSIS — I4891 Unspecified atrial fibrillation: Secondary | ICD-10-CM | POA: Diagnosis not present

## 2023-02-24 DIAGNOSIS — I739 Peripheral vascular disease, unspecified: Secondary | ICD-10-CM | POA: Diagnosis not present

## 2023-02-24 DIAGNOSIS — K219 Gastro-esophageal reflux disease without esophagitis: Secondary | ICD-10-CM | POA: Diagnosis not present

## 2023-02-24 DIAGNOSIS — I440 Atrioventricular block, first degree: Secondary | ICD-10-CM | POA: Diagnosis not present

## 2023-02-24 DIAGNOSIS — I082 Rheumatic disorders of both aortic and tricuspid valves: Secondary | ICD-10-CM | POA: Diagnosis not present

## 2023-02-24 DIAGNOSIS — F17210 Nicotine dependence, cigarettes, uncomplicated: Secondary | ICD-10-CM | POA: Diagnosis not present

## 2023-02-24 DIAGNOSIS — I4891 Unspecified atrial fibrillation: Secondary | ICD-10-CM | POA: Diagnosis not present

## 2023-02-24 DIAGNOSIS — I119 Hypertensive heart disease without heart failure: Secondary | ICD-10-CM | POA: Diagnosis not present

## 2023-02-24 DIAGNOSIS — E782 Mixed hyperlipidemia: Secondary | ICD-10-CM | POA: Diagnosis not present

## 2023-02-24 DIAGNOSIS — N179 Acute kidney failure, unspecified: Secondary | ICD-10-CM | POA: Diagnosis not present

## 2023-02-24 DIAGNOSIS — J449 Chronic obstructive pulmonary disease, unspecified: Secondary | ICD-10-CM | POA: Diagnosis not present

## 2023-02-24 DIAGNOSIS — Z79899 Other long term (current) drug therapy: Secondary | ICD-10-CM | POA: Diagnosis not present

## 2023-02-24 DIAGNOSIS — Z9181 History of falling: Secondary | ICD-10-CM | POA: Diagnosis not present

## 2023-02-24 DIAGNOSIS — I10 Essential (primary) hypertension: Secondary | ICD-10-CM | POA: Diagnosis not present

## 2023-02-24 DIAGNOSIS — Z8673 Personal history of transient ischemic attack (TIA), and cerebral infarction without residual deficits: Secondary | ICD-10-CM | POA: Diagnosis not present

## 2023-02-24 DIAGNOSIS — Z8551 Personal history of malignant neoplasm of bladder: Secondary | ICD-10-CM | POA: Diagnosis not present

## 2023-02-24 DIAGNOSIS — I2510 Atherosclerotic heart disease of native coronary artery without angina pectoris: Secondary | ICD-10-CM | POA: Diagnosis not present

## 2023-02-26 DIAGNOSIS — E782 Mixed hyperlipidemia: Secondary | ICD-10-CM | POA: Diagnosis not present

## 2023-02-26 DIAGNOSIS — F17210 Nicotine dependence, cigarettes, uncomplicated: Secondary | ICD-10-CM | POA: Diagnosis not present

## 2023-02-26 DIAGNOSIS — I082 Rheumatic disorders of both aortic and tricuspid valves: Secondary | ICD-10-CM | POA: Diagnosis not present

## 2023-02-26 DIAGNOSIS — Z8551 Personal history of malignant neoplasm of bladder: Secondary | ICD-10-CM | POA: Diagnosis not present

## 2023-02-26 DIAGNOSIS — N179 Acute kidney failure, unspecified: Secondary | ICD-10-CM | POA: Diagnosis not present

## 2023-02-26 DIAGNOSIS — I4891 Unspecified atrial fibrillation: Secondary | ICD-10-CM | POA: Diagnosis not present

## 2023-02-26 DIAGNOSIS — I119 Hypertensive heart disease without heart failure: Secondary | ICD-10-CM | POA: Diagnosis not present

## 2023-02-26 DIAGNOSIS — Z9181 History of falling: Secondary | ICD-10-CM | POA: Diagnosis not present

## 2023-02-26 DIAGNOSIS — Z79899 Other long term (current) drug therapy: Secondary | ICD-10-CM | POA: Diagnosis not present

## 2023-02-26 DIAGNOSIS — Z8673 Personal history of transient ischemic attack (TIA), and cerebral infarction without residual deficits: Secondary | ICD-10-CM | POA: Diagnosis not present

## 2023-02-26 DIAGNOSIS — K219 Gastro-esophageal reflux disease without esophagitis: Secondary | ICD-10-CM | POA: Diagnosis not present

## 2023-02-26 DIAGNOSIS — I739 Peripheral vascular disease, unspecified: Secondary | ICD-10-CM | POA: Diagnosis not present

## 2023-02-26 DIAGNOSIS — I10 Essential (primary) hypertension: Secondary | ICD-10-CM | POA: Diagnosis not present

## 2023-02-26 DIAGNOSIS — J449 Chronic obstructive pulmonary disease, unspecified: Secondary | ICD-10-CM | POA: Diagnosis not present

## 2023-02-26 DIAGNOSIS — I2510 Atherosclerotic heart disease of native coronary artery without angina pectoris: Secondary | ICD-10-CM | POA: Diagnosis not present

## 2023-02-26 DIAGNOSIS — I440 Atrioventricular block, first degree: Secondary | ICD-10-CM | POA: Diagnosis not present
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
## 2023-03-01 DIAGNOSIS — I4891 Unspecified atrial fibrillation: Secondary | ICD-10-CM | POA: Diagnosis not present

## 2023-03-01 DIAGNOSIS — I2510 Atherosclerotic heart disease of native coronary artery without angina pectoris: Secondary | ICD-10-CM | POA: Diagnosis not present

## 2023-03-01 DIAGNOSIS — I739 Peripheral vascular disease, unspecified: Secondary | ICD-10-CM | POA: Diagnosis not present

## 2023-03-01 DIAGNOSIS — Z8551 Personal history of malignant neoplasm of bladder: Secondary | ICD-10-CM | POA: Diagnosis not present

## 2023-03-01 DIAGNOSIS — F17210 Nicotine dependence, cigarettes, uncomplicated: Secondary | ICD-10-CM | POA: Diagnosis not present

## 2023-03-01 DIAGNOSIS — N179 Acute kidney failure, unspecified: Secondary | ICD-10-CM | POA: Diagnosis not present

## 2023-03-01 DIAGNOSIS — Z9181 History of falling: Secondary | ICD-10-CM | POA: Diagnosis not present

## 2023-03-01 DIAGNOSIS — E782 Mixed hyperlipidemia: Secondary | ICD-10-CM | POA: Diagnosis not present

## 2023-03-01 DIAGNOSIS — K219 Gastro-esophageal reflux disease without esophagitis: Secondary | ICD-10-CM | POA: Diagnosis not present

## 2023-03-01 DIAGNOSIS — I119 Hypertensive heart disease without heart failure: Secondary | ICD-10-CM | POA: Diagnosis not present

## 2023-03-01 DIAGNOSIS — J449 Chronic obstructive pulmonary disease, unspecified: Secondary | ICD-10-CM | POA: Diagnosis not present

## 2023-03-01 DIAGNOSIS — Z79899 Other long term (current) drug therapy: Secondary | ICD-10-CM | POA: Diagnosis not present

## 2023-03-01 DIAGNOSIS — I959 Hypotension, unspecified: Secondary | ICD-10-CM | POA: Diagnosis not present

## 2023-03-01 DIAGNOSIS — Z8673 Personal history of transient ischemic attack (TIA), and cerebral infarction without residual deficits: Secondary | ICD-10-CM | POA: Diagnosis not present

## 2023-03-01 DIAGNOSIS — I10 Essential (primary) hypertension: Secondary | ICD-10-CM | POA: Diagnosis not present

## 2023-03-01 DIAGNOSIS — I440 Atrioventricular block, first degree: Secondary | ICD-10-CM | POA: Diagnosis not present

## 2023-03-01 DIAGNOSIS — I082 Rheumatic disorders of both aortic and tricuspid valves: Secondary | ICD-10-CM | POA: Diagnosis not present

## 2023-03-02 ENCOUNTER — Ambulatory Visit (HOSPITAL_COMMUNITY): Admission: RE | Admit: 2023-03-02 | Payer: 59 | Source: Ambulatory Visit

## 2023-03-02 ENCOUNTER — Ambulatory Visit: Payer: 59 | Admitting: Internal Medicine

## 2023-03-04 ENCOUNTER — Telehealth: Payer: Self-pay | Admitting: Internal Medicine

## 2023-03-04 DIAGNOSIS — Z8673 Personal history of transient ischemic attack (TIA), and cerebral infarction without residual deficits: Secondary | ICD-10-CM | POA: Diagnosis not present

## 2023-03-04 DIAGNOSIS — I739 Peripheral vascular disease, unspecified: Secondary | ICD-10-CM | POA: Diagnosis not present

## 2023-03-04 DIAGNOSIS — I119 Hypertensive heart disease without heart failure: Secondary | ICD-10-CM | POA: Diagnosis not present

## 2023-03-04 DIAGNOSIS — Z9181 History of falling: Secondary | ICD-10-CM | POA: Diagnosis not present

## 2023-03-04 DIAGNOSIS — F17210 Nicotine dependence, cigarettes, uncomplicated: Secondary | ICD-10-CM | POA: Diagnosis not present

## 2023-03-04 DIAGNOSIS — K219 Gastro-esophageal reflux disease without esophagitis: Secondary | ICD-10-CM | POA: Diagnosis not present

## 2023-03-04 DIAGNOSIS — J449 Chronic obstructive pulmonary disease, unspecified: Secondary | ICD-10-CM | POA: Diagnosis not present

## 2023-03-04 DIAGNOSIS — I10 Essential (primary) hypertension: Secondary | ICD-10-CM | POA: Diagnosis not present

## 2023-03-04 DIAGNOSIS — I4891 Unspecified atrial fibrillation: Secondary | ICD-10-CM | POA: Diagnosis not present

## 2023-03-04 DIAGNOSIS — E782 Mixed hyperlipidemia: Secondary | ICD-10-CM | POA: Diagnosis not present

## 2023-03-04 DIAGNOSIS — I2510 Atherosclerotic heart disease of native coronary artery without angina pectoris: Secondary | ICD-10-CM | POA: Diagnosis not present

## 2023-03-04 DIAGNOSIS — Z8551 Personal history of malignant neoplasm of bladder: Secondary | ICD-10-CM | POA: Diagnosis not present

## 2023-03-04 DIAGNOSIS — Z79899 Other long term (current) drug therapy: Secondary | ICD-10-CM | POA: Diagnosis not present

## 2023-03-04 DIAGNOSIS — I082 Rheumatic disorders of both aortic and tricuspid valves: Secondary | ICD-10-CM | POA: Diagnosis not present

## 2023-03-04 DIAGNOSIS — I440 Atrioventricular block, first degree: Secondary | ICD-10-CM | POA: Diagnosis not present

## 2023-03-04 DIAGNOSIS — N179 Acute kidney failure, unspecified: Secondary | ICD-10-CM | POA: Diagnosis not present

## 2023-03-04 NOTE — Telephone Encounter (Signed)
 Spoke with patient regarding the Tuesday 04/27/23 10:00 am PFT appointment at [facility] time is 9:45 am  at the 1st floor registration desk for check in---follow up with Dr. Sherene Sires is  11:15 am same day---will mail information to patient and he voiced his understanding

## 2023-03-10 ENCOUNTER — Telehealth: Payer: Self-pay | Admitting: *Deleted

## 2023-03-10 DIAGNOSIS — I440 Atrioventricular block, first degree: Secondary | ICD-10-CM | POA: Diagnosis not present

## 2023-03-10 DIAGNOSIS — R911 Solitary pulmonary nodule: Secondary | ICD-10-CM

## 2023-03-10 DIAGNOSIS — I119 Hypertensive heart disease without heart failure: Secondary | ICD-10-CM | POA: Diagnosis not present

## 2023-03-10 DIAGNOSIS — I4891 Unspecified atrial fibrillation: Secondary | ICD-10-CM | POA: Diagnosis not present

## 2023-03-10 DIAGNOSIS — Z8673 Personal history of transient ischemic attack (TIA), and cerebral infarction without residual deficits: Secondary | ICD-10-CM | POA: Diagnosis not present

## 2023-03-10 DIAGNOSIS — J449 Chronic obstructive pulmonary disease, unspecified: Secondary | ICD-10-CM | POA: Diagnosis not present

## 2023-03-10 DIAGNOSIS — I2510 Atherosclerotic heart disease of native coronary artery without angina pectoris: Secondary | ICD-10-CM | POA: Diagnosis not present

## 2023-03-10 DIAGNOSIS — I739 Peripheral vascular disease, unspecified: Secondary | ICD-10-CM | POA: Diagnosis not present

## 2023-03-10 DIAGNOSIS — N179 Acute kidney failure, unspecified: Secondary | ICD-10-CM | POA: Diagnosis not present

## 2023-03-10 DIAGNOSIS — Z8551 Personal history of malignant neoplasm of bladder: Secondary | ICD-10-CM | POA: Diagnosis not present

## 2023-03-10 DIAGNOSIS — I10 Essential (primary) hypertension: Secondary | ICD-10-CM | POA: Diagnosis not present

## 2023-03-10 DIAGNOSIS — E782 Mixed hyperlipidemia: Secondary | ICD-10-CM | POA: Diagnosis not present

## 2023-03-10 DIAGNOSIS — K219 Gastro-esophageal reflux disease without esophagitis: Secondary | ICD-10-CM | POA: Diagnosis not present

## 2023-03-10 DIAGNOSIS — Z79899 Other long term (current) drug therapy: Secondary | ICD-10-CM | POA: Diagnosis not present

## 2023-03-10 DIAGNOSIS — Z9181 History of falling: Secondary | ICD-10-CM | POA: Diagnosis not present

## 2023-03-10 DIAGNOSIS — I082 Rheumatic disorders of both aortic and tricuspid valves: Secondary | ICD-10-CM | POA: Diagnosis not present

## 2023-03-10 DIAGNOSIS — F17210 Nicotine dependence, cigarettes, uncomplicated: Secondary | ICD-10-CM | POA: Diagnosis not present

## 2023-03-10 NOTE — Telephone Encounter (Addendum)
-----   Message from Sandrea Hughs sent at 01/19/2023 10:18 AM EST ----- Needs ct chest by now (may have been done at [facility] by now)   Left upper lobe nodule 4 mm, likely inflammatory, noted on CT chest 09/2022. Needs follow up CT chest in 03/2023.   I called the pt to remind him CT is due. There was no answer- LMTCB. I pended the CT order.

## 2023-03-18 NOTE — Telephone Encounter (Signed)
 I called and spoke with the pt and notified of recommendations from Dr Sherene Sires  He verbalized understanding  Nothing further needed CT was ordered

## 2023-04-01 ENCOUNTER — Ambulatory Visit: Payer: 59 | Admitting: Urology

## 2023-04-01 ENCOUNTER — Ambulatory Visit (HOSPITAL_COMMUNITY)

## 2023-04-01 ENCOUNTER — Encounter: Payer: Self-pay | Admitting: Urology

## 2023-04-01 VITALS — BP 152/76 | HR 67

## 2023-04-01 DIAGNOSIS — R972 Elevated prostate specific antigen [PSA]: Secondary | ICD-10-CM

## 2023-04-01 DIAGNOSIS — R3915 Urgency of urination: Secondary | ICD-10-CM

## 2023-04-01 DIAGNOSIS — N5201 Erectile dysfunction due to arterial insufficiency: Secondary | ICD-10-CM

## 2023-04-01 DIAGNOSIS — N403 Nodular prostate with lower urinary tract symptoms: Secondary | ICD-10-CM | POA: Diagnosis not present

## 2023-04-01 DIAGNOSIS — Z8551 Personal history of malignant neoplasm of bladder: Secondary | ICD-10-CM | POA: Diagnosis not present

## 2023-04-01 LAB — MICROSCOPIC EXAMINATION
Bacteria, UA: NONE SEEN
Epithelial Cells (non renal): NONE SEEN /HPF (ref 0–10)

## 2023-04-01 LAB — URINALYSIS, ROUTINE W REFLEX MICROSCOPIC
Bilirubin, UA: NEGATIVE
Glucose, UA: NEGATIVE
Ketones, UA: NEGATIVE
Leukocytes,UA: NEGATIVE
Nitrite, UA: NEGATIVE
RBC, UA: NEGATIVE
Specific Gravity, UA: 1.01 (ref 1.005–1.030)
Urobilinogen, Ur: 2 mg/dL — ABNORMAL HIGH (ref 0.2–1.0)
pH, UA: 6 (ref 5.0–7.5)
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
## 2023-04-05 ENCOUNTER — Ambulatory Visit: Payer: 59 | Admitting: Internal Medicine

## 2023-04-08 ENCOUNTER — Other Ambulatory Visit: Payer: Self-pay

## 2023-04-08 DIAGNOSIS — R972 Elevated prostate specific antigen [PSA]: Secondary | ICD-10-CM

## 2023-04-09 NOTE — Addendum Note (Signed)
 Addended by: Grier Rocher on: 04/09/2023 11:56 AM   Modules accepted: Orders

## 2023-04-15 ENCOUNTER — Encounter (HOSPITAL_COMMUNITY): Payer: 59
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
## 2023-04-27 ENCOUNTER — Ambulatory Visit: Payer: 59 | Admitting: Internal Medicine

## 2023-04-27 ENCOUNTER — Encounter: Payer: Self-pay | Admitting: Internal Medicine

## 2023-04-27 ENCOUNTER — Ambulatory Visit (HOSPITAL_COMMUNITY): Admission: RE | Admit: 2023-04-27 | Payer: 59 | Source: Ambulatory Visit

## 2023-05-13 DIAGNOSIS — R42 Dizziness and giddiness: Secondary | ICD-10-CM | POA: Diagnosis not present

## 2023-05-13 DIAGNOSIS — I10 Essential (primary) hypertension: Secondary | ICD-10-CM | POA: Diagnosis not present

## 2023-05-14 ENCOUNTER — Ambulatory Visit (HOSPITAL_COMMUNITY)

## 2023-05-19 ENCOUNTER — Other Ambulatory Visit: Admitting: Urology

## 2023-05-19 ENCOUNTER — Ambulatory Visit (HOSPITAL_COMMUNITY)

## 2023-05-21 DIAGNOSIS — I10 Essential (primary) hypertension: Secondary | ICD-10-CM | POA: Diagnosis not present

## 2023-05-21 DIAGNOSIS — R42 Dizziness and giddiness: Secondary | ICD-10-CM | POA: Diagnosis not present

## 2023-05-21 DIAGNOSIS — E782 Mixed hyperlipidemia: Secondary | ICD-10-CM | POA: Diagnosis not present

## 2023-05-26 ENCOUNTER — Other Ambulatory Visit: Admitting: Urology

## 2023-05-27 ENCOUNTER — Telehealth: Payer: Self-pay | Admitting: Internal Medicine

## 2023-05-27 ENCOUNTER — Other Ambulatory Visit (HOSPITAL_COMMUNITY)

## 2023-05-27 DIAGNOSIS — F17210 Nicotine dependence, cigarettes, uncomplicated: Secondary | ICD-10-CM

## 2023-05-27 DIAGNOSIS — J449 Chronic obstructive pulmonary disease, unspecified: Secondary | ICD-10-CM

## 2023-05-27 NOTE — Telephone Encounter (Signed)
 Fax received from Dr. Claretta Croft with [facility] [place] to perform a prostate biopsy and cyctoscopy procedure on patient.  Patient needs surgery clearance. Surgery is pending per pt. Patient was seen on 01/19/23. Office protocol is a risk assessment can be sent to surgeon if patient has been seen in 60 days or less.   Pt never followed up with Dr. Waymond Hailey after last visit and was a no show for his PFT. I called him and explained needs to have PFT and ov before we can give risk assessment. I scheduled him to see Beth in [place] 06/30/23 and ordered PFT to be completed at [facility] prior to this visit. Will route back to clearance pool for now. I did fax Urology and let them know that the pt needs to be seen first.

## 2023-06-02 ENCOUNTER — Ambulatory Visit: Admitting: Urology

## 2023-06-03 ENCOUNTER — Ambulatory Visit (HOSPITAL_COMMUNITY)

## 2023-06-09 ENCOUNTER — Telehealth: Payer: Self-pay | Admitting: Internal Medicine

## 2023-06-09 NOTE — Telephone Encounter (Signed)
 Copied from CRM [id number]. Topic: Clinical - Prescription Issue >> Jun 08, 2023  4:52 PM [name] wrote: Reason for CRM: Mark Waller from [facility] pharmacy called to verify if a patient is taking a medication due to the interaction with another. Pt is prescribed by Dr. Waymond Hailey olmesartan  20 mg and was already on Lisinopril 10 mg. Mark Waller wanted to know if he was okay to refill this medication (Olmesartan  20 mg) or not. Please call back at [phone] and can talk to either pharmacist.   I called and spoke with pharmacist at [facility] He states that they still have lisinopril on file for the pt- last filled 04/22/23  He is also still getting the olmesartan  filled  I advised that in Jan 2025 we told him to d/c lisinopril and take the olmesartan  Pharmacist will d/c to lisinopril rx  I called the pt to discuss with him and there was no answer and his vm was full  Routing to Dr. Waymond Hailey as FYI

## 2023-06-09 NOTE — Telephone Encounter (Signed)
 Copied from CRM [id number]. Topic: Clinical - Prescription Issue >> Jun 08, 2023  4:52 PM [name] wrote: Reason for CRM: John from [facility] pharmacy called to verify if a patient is taking a medication due to the interaction with another. Pt is prescribed by Dr. Waymond Hailey olmesartan  20 mg and was already on Lisinopril 10 mg. Autry Legions wanted to know if he was okay to refill this medication (Olmesartan  20 mg) or not. Please call back at [phone] and can talk to either pharmacist.

## 2023-06-10 ENCOUNTER — Other Ambulatory Visit: Payer: Self-pay

## 2023-06-10 DIAGNOSIS — R972 Elevated prostate specific antigen [PSA]: Secondary | ICD-10-CM

## 2023-06-11 ENCOUNTER — Ambulatory Visit: Admitting: Urology

## 2023-06-11 NOTE — Telephone Encounter (Signed)
 Received call from E2C[employer] agent- pt had called and told her that he stopped olmesartan  shortly after last visit It "made him feel funny"  He has been taking his lisinopril and now needing refill  I spoke with E[facility] and informed that Dr Waymond Hailey can address this next wk, but in the meantime he can call his PCP E2C2 will update Dr Dela Favor  Routing to Dr. Waymond Hailey

## 2023-06-16 ENCOUNTER — Ambulatory Visit: Admitting: Urology

## 2023-06-17 ENCOUNTER — Ambulatory Visit (HOSPITAL_COMMUNITY): Admission: RE | Admit: 2023-06-17 | Source: Ambulatory Visit

## 2023-06-21 DIAGNOSIS — F17200 Nicotine dependence, unspecified, uncomplicated: Secondary | ICD-10-CM | POA: Diagnosis not present

## 2023-06-21 DIAGNOSIS — J449 Chronic obstructive pulmonary disease, unspecified: Secondary | ICD-10-CM | POA: Diagnosis not present

## 2023-06-21 DIAGNOSIS — I10 Essential (primary) hypertension: Secondary | ICD-10-CM | POA: Diagnosis not present

## 2023-06-21 DIAGNOSIS — E785 Hyperlipidemia, unspecified: Secondary | ICD-10-CM | POA: Diagnosis not present

## 2023-06-21 DIAGNOSIS — R42 Dizziness and giddiness: Secondary | ICD-10-CM | POA: Diagnosis not present

## 2023-06-30 ENCOUNTER — Telehealth: Payer: Self-pay

## 2023-06-30 ENCOUNTER — Telehealth: Payer: Self-pay | Admitting: Primary Care

## 2023-06-30 ENCOUNTER — Encounter: Payer: Self-pay | Admitting: Primary Care

## 2023-06-30 ENCOUNTER — Ambulatory Visit (INDEPENDENT_AMBULATORY_CARE_PROVIDER_SITE_OTHER): Admitting: Primary Care

## 2023-06-30 VITALS — BP 123/71 | HR 65 | Ht 72.0 in | Wt 194.0 lb

## 2023-06-30 DIAGNOSIS — I10 Essential (primary) hypertension: Secondary | ICD-10-CM

## 2023-06-30 DIAGNOSIS — R911 Solitary pulmonary nodule: Secondary | ICD-10-CM | POA: Diagnosis not present

## 2023-06-30 DIAGNOSIS — F17210 Nicotine dependence, cigarettes, uncomplicated: Secondary | ICD-10-CM

## 2023-06-30 DIAGNOSIS — J449 Chronic obstructive pulmonary disease, unspecified: Secondary | ICD-10-CM

## 2023-06-30 MED ORDER — GUAIFENESIN ER 600 MG PO TB12
600.0000 mg | ORAL_TABLET | Freq: Two times a day (BID) | ORAL | 1 refills | Status: DC | PRN
Start: 2023-06-30 — End: 2023-08-23

## 2023-06-30 MED ORDER — SPIRIVA RESPIMAT 2.5 MCG/ACT IN AERS
2.0000 | INHALATION_SPRAY | Freq: Every day | RESPIRATORY_TRACT | 5 refills | Status: DC
Start: 2023-06-30 — End: 2023-08-23

## 2023-06-30 NOTE — Telephone Encounter (Signed)
 ATC x1 VM full  - regarding Beths note about inhalers and Dr Leisa insructions
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
## 2023-06-30 NOTE — Patient Instructions (Addendum)
  VISIT SUMMARY: Today, we discussed your ongoing respiratory symptoms related to COPD, your lung nodule, nicotine dependence, and hypertension. We also talked about your upcoming cystoscopy and prostate biopsy, and how to optimize your health before surgery.  YOUR PLAN: -CHRONIC OBSTRUCTIVE PULMONARY DISEASE (COPD): COPD is a chronic lung condition that makes it hard to breathe. You will continue using Spiriva  Respimat daily and albuterol  as needed. We recommend scheduling a pulmonary function test, using a wedge pillow to elevate your head during sleep, and taking guaifenesin to help loosen phlegm and reduce coughing.  -LUNG NODULE: A lung nodule is a small growth in the lung that can be benign or malignant. You have a 4 mm nodule in your left upper lung lobe that needs a follow-up CT scan to monitor any changes. Please schedule this scan as soon as possible.  -NICOTINE DEPENDENCE: Nicotine dependence is an addiction to tobacco products. You are currently smoking 2-3 cigarettes a day. We discussed strategies to gradually reduce your cigarette use and consider using nicotine replacement therapies like lozenges or gum. Your goal is to quit smoking by your next visit.  -HYPERTENSION: Hypertension is high blood pressure. Your blood pressure is well-controlled with your current medications, amlodipine  and olmesartan . Continue taking amlodipine  2.5 mg daily and olmesartan  20 mg daily.  INSTRUCTIONS: Please schedule a pulmonary function test and a follow-up CT scan for your lung nodule. Continue using your current medications as prescribed. Gradually reduce your cigarette use and consider nicotine replacement therapies. Follow up in 3-4 months to reassess your COPD management and smoking cessation progress.   Follow-up 3-4 months with Dr. Darlean or sooner if needed / needs PFTs scheduled at [facility]

## 2023-06-30 NOTE — Telephone Encounter (Signed)
 Can we please schedule CT chest scan, due in March Also needs PFTs at [facility] scheduled Contact [phone]

## 2023-06-30 NOTE — Telephone Encounter (Signed)
 Patient called requesting earlier appt times for his visits because he keeps his grandchildren.  I informed him that unfortunatly we do not have any early appts open at this time.  Patient agreed to keep scheduled visits.

## 2023-06-30 NOTE — Telephone Encounter (Signed)
 Let patient know upon review Dr. DARLEAN wanted him to change his inhaler from the Spiriva  handhalier (the capsule) to Spiriva  respimat (green bottom) two puffs daily in the morning. I have send that prescription in. If he needs help using inhaler he can call us  or ask pharmacist to show him

## 2023-07-02 ENCOUNTER — Ambulatory Visit: Admitting: Urology

## 2023-07-06 NOTE — Telephone Encounter (Signed)
 Pt states he can not use Spiriva  respimat it makes him dizzy, pt going to continue with Spiriva  handhalier for now. NFN

## 2023-07-07 ENCOUNTER — Other Ambulatory Visit

## 2023-07-08 ENCOUNTER — Ambulatory Visit: Payer: Self-pay

## 2023-07-08 LAB — PSA: Prostate Specific Ag, Serum: 19.2 ng/mL — ABNORMAL HIGH (ref 0.0–4.0)

## 2023-07-13 ENCOUNTER — Ambulatory Visit (HOSPITAL_COMMUNITY)
Admission: RE | Admit: 2023-07-13 | Discharge: 2023-07-13 | Disposition: A | Discharge status: Home / Self Care | Source: Ambulatory Visit | Attending: Internal Medicine | Admitting: Internal Medicine

## 2023-07-13 ENCOUNTER — Ambulatory Visit (INDEPENDENT_AMBULATORY_CARE_PROVIDER_SITE_OTHER): Admitting: Urology

## 2023-07-13 ENCOUNTER — Encounter: Payer: Self-pay | Admitting: Urology

## 2023-07-13 VITALS — BP 99/57 | HR 69 | Temp 98.6°F

## 2023-07-13 DIAGNOSIS — J449 Chronic obstructive pulmonary disease, unspecified: Secondary | ICD-10-CM | POA: Diagnosis not present

## 2023-07-13 DIAGNOSIS — R3915 Urgency of urination: Secondary | ICD-10-CM | POA: Diagnosis not present

## 2023-07-13 DIAGNOSIS — R6889 Other general symptoms and signs: Secondary | ICD-10-CM

## 2023-07-13 DIAGNOSIS — F17210 Nicotine dependence, cigarettes, uncomplicated: Secondary | ICD-10-CM | POA: Insufficient documentation

## 2023-07-13 DIAGNOSIS — R972 Elevated prostate specific antigen [PSA]: Secondary | ICD-10-CM | POA: Diagnosis not present

## 2023-07-13 DIAGNOSIS — N403 Nodular prostate with lower urinary tract symptoms: Secondary | ICD-10-CM

## 2023-07-13 DIAGNOSIS — C679 Malignant neoplasm of bladder, unspecified: Secondary | ICD-10-CM

## 2023-07-13 LAB — PULMONARY FUNCTION TEST
DL/VA % pred: 62 %
DL/VA: 2.55 ml/min/mmHg/L
DLCO unc % pred: 58 %
DLCO unc: 16.53 ml/min/mmHg
FEF 25-75 Post: 1.98 L/s
FEF 25-75 Pre: 1.87 L/s
FEF2575-%Change-Post: 5 %
FEF2575-%Pred-Post: 67 %
FEF2575-%Pred-Pre: 64 %
FEV1-%Change-Post: 2 %
FEV1-%Pred-Post: 86 %
FEV1-%Pred-Pre: 84 %
FEV1-Post: 3.21 L
FEV1-Pre: 3.13 L
FEV1FVC-%Change-Post: -3 %
FEV1FVC-%Pred-Pre: 89 %
FEV6-%Change-Post: 3 %
FEV6-%Pred-Post: 100 %
FEV6-%Pred-Pre: 96 %
FEV6-Post: 4.72 L
FEV6-Pre: 4.56 L
FEV6FVC-%Change-Post: -3 %
FEV6FVC-%Pred-Post: 99 %
FEV6FVC-%Pred-Pre: 102 %
FVC-%Change-Post: 6 %
FVC-%Pred-Post: 100 %
FVC-%Pred-Pre: 94 %
FVC-Post: 4.99 L
FVC-Pre: 4.68 L
Post FEV1/FVC ratio: 64 %
Post FEV6/FVC ratio: 94 %
Pre FEV1/FVC ratio: 67 %
Pre FEV6/FVC Ratio: 98 %
RV % pred: 165 %
RV: 4.07 L
TLC % pred: 123 %
TLC: 9.12 L

## 2023-07-13 MED ORDER — ALBUTEROL SULFATE (2.5 MG/3ML) 0.083% IN NEBU
2.5000 mg | INHALATION_SOLUTION | Freq: Once | RESPIRATORY_TRACT | Status: AC
  Administered 2023-07-13: 2.5 mg via RESPIRATORY_TRACT

## 2023-07-13 NOTE — Patient Instructions (Signed)
 Upcoming procedures: - 07/29/2023: Prostate biopsy by Dr. Sherrilee. - 08/26/2023: Cystoscopy with retrograde pyelograms by Dr. Sherrilee.
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
## 2023-07-14 LAB — URINALYSIS, ROUTINE W REFLEX MICROSCOPIC
Bilirubin, UA: NEGATIVE
Glucose, UA: NEGATIVE
Ketones, UA: NEGATIVE
Nitrite, UA: NEGATIVE
RBC, UA: NEGATIVE
Specific Gravity, UA: 1.02 (ref 1.005–1.030)
Urobilinogen, Ur: 4 mg/dL — ABNORMAL HIGH (ref 0.2–1.0)
pH, UA: 6 (ref 5.0–7.5)

## 2023-07-14 LAB — MICROSCOPIC EXAMINATION: Bacteria, UA: NONE SEEN

## 2023-07-15 ENCOUNTER — Encounter (HOSPITAL_COMMUNITY): Admission: RE | Payer: Self-pay | Source: Home / Self Care

## 2023-07-15 ENCOUNTER — Ambulatory Visit: Payer: Self-pay | Admitting: Internal Medicine

## 2023-07-15 ENCOUNTER — Encounter: Payer: Self-pay | Admitting: Internal Medicine

## 2023-07-15 SURGERY — CYSTOSCOPY, WITH RETROGRADE PYELOGRAM
Anesthesia: General | Laterality: Bilateral

## 2023-07-15 NOTE — Telephone Encounter (Signed)
 Spoke to pt regarding his pft results, he confirmed his understanding. NFN

## 2023-07-21 ENCOUNTER — Ambulatory Visit: Admitting: Urology

## 2023-07-24 ENCOUNTER — Ambulatory Visit (HOSPITAL_COMMUNITY)
Admission: RE | Admit: 2023-07-24 | Discharge: 2023-07-24 | Disposition: A | Discharge status: Home / Self Care | Source: Ambulatory Visit | Attending: Internal Medicine | Admitting: Internal Medicine

## 2023-07-24 DIAGNOSIS — J439 Emphysema, unspecified: Secondary | ICD-10-CM | POA: Diagnosis not present

## 2023-07-24 DIAGNOSIS — R918 Other nonspecific abnormal finding of lung field: Secondary | ICD-10-CM | POA: Diagnosis not present

## 2023-07-24 DIAGNOSIS — R911 Solitary pulmonary nodule: Secondary | ICD-10-CM | POA: Insufficient documentation

## 2023-07-24 DIAGNOSIS — R590 Localized enlarged lymph nodes: Secondary | ICD-10-CM | POA: Diagnosis not present

## 2023-07-24 DIAGNOSIS — I700 Atherosclerosis of aorta: Secondary | ICD-10-CM | POA: Diagnosis not present

## 2023-07-25 ENCOUNTER — Ambulatory Visit: Payer: Self-pay | Admitting: Internal Medicine

## 2023-07-25 DIAGNOSIS — R911 Solitary pulmonary nodule: Secondary | ICD-10-CM

## 2023-07-26 ENCOUNTER — Other Ambulatory Visit: Payer: Self-pay

## 2023-07-26 ENCOUNTER — Encounter (HOSPITAL_COMMUNITY)
Admission: RE | Admit: 2023-07-26 | Discharge: 2023-07-26 | Disposition: A | Discharge status: Home / Self Care | Source: Ambulatory Visit | Attending: Urology | Admitting: Urology

## 2023-07-26 ENCOUNTER — Encounter (HOSPITAL_COMMUNITY): Payer: Self-pay

## 2023-07-26 ENCOUNTER — Encounter (HOSPITAL_COMMUNITY): Payer: Self-pay | Admitting: Anesthesiology

## 2023-07-27 ENCOUNTER — Other Ambulatory Visit: Payer: Self-pay

## 2023-07-27 NOTE — Progress Notes (Signed)
 ATC x1. Lmtcb

## 2023-07-28 ENCOUNTER — Ambulatory Visit: Admitting: Urology

## 2023-07-28 NOTE — Telephone Encounter (Signed)
 [facility], pt is having prostate biopsy and cystoscopy at [facility] under general anesthesia.     Dr Sherrilee and is requesting a risk assessment.  Patient has been seen by Pulmonology, but has not completed his Myocardial Perfusion test.

## 2023-07-28 NOTE — Telephone Encounter (Signed)
 Spoke with pt regarding his result, and for him to hat the PET, he confirmed his understanding. NFN

## 2023-07-28 NOTE — Telephone Encounter (Signed)
 Called and left message for patient to contact office to discuss medical clearance

## 2023-07-29 ENCOUNTER — Encounter (HOSPITAL_COMMUNITY): Payer: Self-pay | Admitting: Anesthesiology

## 2023-07-29 ENCOUNTER — Encounter (HOSPITAL_COMMUNITY): Payer: Self-pay

## 2023-07-29 ENCOUNTER — Ambulatory Visit (HOSPITAL_COMMUNITY): Admission: RE | Admit: 2023-07-29 | Source: Home / Self Care | Admitting: Urology

## 2023-07-29 ENCOUNTER — Encounter (HOSPITAL_COMMUNITY): Admission: RE | Payer: Self-pay | Source: Home / Self Care

## 2023-07-29 ENCOUNTER — Ambulatory Visit (HOSPITAL_COMMUNITY)
Admission: RE | Admit: 2023-07-29 | Discharge: 2023-07-29 | Disposition: A | Discharge status: Home / Self Care | Source: Ambulatory Visit | Attending: Urology | Admitting: Urology

## 2023-07-29 DIAGNOSIS — Z539 Procedure and treatment not carried out, unspecified reason: Secondary | ICD-10-CM | POA: Insufficient documentation

## 2023-07-29 DIAGNOSIS — R972 Elevated prostate specific antigen [PSA]: Secondary | ICD-10-CM | POA: Insufficient documentation

## 2023-07-29 SURGERY — BIOPSY, PROSTATE, RECTAL APPROACH, WITH US GUIDANCE
Anesthesia: General

## 2023-07-29 MED ORDER — CEFTRIAXONE SODIUM 1 G IJ SOLR
INTRAMUSCULAR | Status: AC
  Filled 2023-07-29: qty 10

## 2023-07-29 MED ORDER — LIDOCAINE HCL (PF) 2 % IJ SOLN: 10.0000 mL | Freq: Once | INTRAMUSCULAR | Status: DC

## 2023-07-29 MED ORDER — LIDOCAINE HCL (PF) 2 % IJ SOLN
INTRAMUSCULAR | Status: AC
  Filled 2023-07-29: qty 10

## 2023-07-29 MED ORDER — LIDOCAINE HCL (PF) 1 % IJ SOLN
INTRAMUSCULAR | Status: AC
  Filled 2023-07-29: qty 5

## 2023-07-29 MED ORDER — CEFTRIAXONE SODIUM 1 G IJ SOLR: 1.0000 g | Freq: Once | INTRAMUSCULAR | Status: DC

## 2023-07-29 MED ORDER — LIDOCAINE HCL (PF) 1 % IJ SOLN: 2.1000 mL | Freq: Once | INTRAMUSCULAR | Status: DC

## 2023-08-05 ENCOUNTER — Ambulatory Visit (HOSPITAL_COMMUNITY)
Admission: RE | Admit: 2023-08-05 | Discharge: 2023-08-05 | Disposition: A | Discharge status: Home / Self Care | Source: Ambulatory Visit | Attending: Internal Medicine | Admitting: Internal Medicine

## 2023-08-05 DIAGNOSIS — R918 Other nonspecific abnormal finding of lung field: Secondary | ICD-10-CM | POA: Diagnosis not present

## 2023-08-05 DIAGNOSIS — R911 Solitary pulmonary nodule: Secondary | ICD-10-CM | POA: Diagnosis not present

## 2023-08-05 MED ORDER — FLUDEOXYGLUCOSE F - 18 (FDG) INJECTION
10.4300 | Freq: Once | INTRAVENOUS | Status: AC | PRN
  Administered 2023-08-05: 10.43 via INTRAVENOUS

## 2023-08-06 ENCOUNTER — Ambulatory Visit: Admitting: Urology

## 2023-08-10 ENCOUNTER — Ambulatory Visit: Payer: Self-pay | Admitting: Internal Medicine

## 2023-08-10 DIAGNOSIS — R911 Solitary pulmonary nodule: Secondary | ICD-10-CM

## 2023-08-10 NOTE — Progress Notes (Signed)
 Spoke with pt regarding results he confirmed his understanding. Placing order for ct chest w contrast.

## 2023-08-17 DIAGNOSIS — I209 Angina pectoris, unspecified: Secondary | ICD-10-CM | POA: Diagnosis not present

## 2023-08-17 DIAGNOSIS — Z01810 Encounter for preprocedural cardiovascular examination: Secondary | ICD-10-CM | POA: Diagnosis not present

## 2023-08-17 DIAGNOSIS — R931 Abnormal findings on diagnostic imaging of heart and coronary circulation: Secondary | ICD-10-CM | POA: Diagnosis not present

## 2023-08-17 DIAGNOSIS — I2510 Atherosclerotic heart disease of native coronary artery without angina pectoris: Secondary | ICD-10-CM | POA: Diagnosis not present

## 2023-08-17 DIAGNOSIS — R0602 Shortness of breath: Secondary | ICD-10-CM | POA: Diagnosis not present

## 2023-08-24 ENCOUNTER — Other Ambulatory Visit: Payer: Self-pay

## 2023-08-24 ENCOUNTER — Encounter (HOSPITAL_COMMUNITY)
Admission: RE | Admit: 2023-08-24 | Discharge: 2023-08-24 | Disposition: A | Discharge status: Home / Self Care | Source: Ambulatory Visit | Attending: Urology | Admitting: Urology

## 2023-08-24 ENCOUNTER — Encounter (HOSPITAL_COMMUNITY): Payer: Self-pay

## 2023-08-25 MED ORDER — GENTAMICIN SULFATE 40 MG/ML IJ SOLN
5.0000 mg/kg | INTRAVENOUS | Status: AC
  Administered 2023-08-26: 410 mg via INTRAVENOUS
  Filled 2023-08-25: qty 10.25

## 2023-08-26 ENCOUNTER — Ambulatory Visit (HOSPITAL_COMMUNITY): Admitting: Anesthesiology

## 2023-08-26 ENCOUNTER — Other Ambulatory Visit: Payer: Self-pay

## 2023-08-26 ENCOUNTER — Encounter (HOSPITAL_COMMUNITY)
Admission: RE | Disposition: A | Discharge status: Home / Self Care | Payer: Self-pay | Source: Home / Self Care | Attending: Urology

## 2023-08-26 ENCOUNTER — Ambulatory Visit (HOSPITAL_COMMUNITY)
Admission: RE | Admit: 2023-08-26 | Discharge: 2023-08-26 | Disposition: A | Discharge status: Home / Self Care | Attending: Urology | Admitting: Urology

## 2023-08-26 ENCOUNTER — Other Ambulatory Visit: Payer: Self-pay | Admitting: Urology

## 2023-08-26 ENCOUNTER — Encounter (HOSPITAL_COMMUNITY): Payer: Self-pay | Admitting: Urology

## 2023-08-26 ENCOUNTER — Ambulatory Visit: Payer: Self-pay

## 2023-08-26 ENCOUNTER — Ambulatory Visit (HOSPITAL_BASED_OUTPATIENT_CLINIC_OR_DEPARTMENT_OTHER): Admitting: Anesthesiology

## 2023-08-26 ENCOUNTER — Ambulatory Visit (HOSPITAL_COMMUNITY)
Admission: RE | Admit: 2023-08-26 | Discharge: 2023-08-26 | Disposition: A | Discharge status: Home / Self Care | Source: Ambulatory Visit | Attending: Urology | Admitting: Urology

## 2023-08-26 DIAGNOSIS — R351 Nocturia: Secondary | ICD-10-CM | POA: Diagnosis not present

## 2023-08-26 DIAGNOSIS — R972 Elevated prostate specific antigen [PSA]: Secondary | ICD-10-CM | POA: Insufficient documentation

## 2023-08-26 DIAGNOSIS — I2510 Atherosclerotic heart disease of native coronary artery without angina pectoris: Secondary | ICD-10-CM

## 2023-08-26 DIAGNOSIS — I10 Essential (primary) hypertension: Secondary | ICD-10-CM

## 2023-08-26 DIAGNOSIS — C679 Malignant neoplasm of bladder, unspecified: Secondary | ICD-10-CM | POA: Diagnosis not present

## 2023-08-26 DIAGNOSIS — N529 Male erectile dysfunction, unspecified: Secondary | ICD-10-CM | POA: Diagnosis not present

## 2023-08-26 DIAGNOSIS — F17200 Nicotine dependence, unspecified, uncomplicated: Secondary | ICD-10-CM | POA: Diagnosis not present

## 2023-08-26 DIAGNOSIS — N403 Nodular prostate with lower urinary tract symptoms: Secondary | ICD-10-CM | POA: Insufficient documentation

## 2023-08-26 DIAGNOSIS — C61 Malignant neoplasm of prostate: Secondary | ICD-10-CM

## 2023-08-26 DIAGNOSIS — F17210 Nicotine dependence, cigarettes, uncomplicated: Secondary | ICD-10-CM | POA: Diagnosis not present

## 2023-08-26 DIAGNOSIS — R3915 Urgency of urination: Secondary | ICD-10-CM | POA: Diagnosis not present

## 2023-08-26 DIAGNOSIS — J449 Chronic obstructive pulmonary disease, unspecified: Secondary | ICD-10-CM | POA: Diagnosis not present

## 2023-08-26 HISTORY — PX: PROSTATE BIOPSY: SHX241

## 2023-08-26 SURGERY — BIOPSY, PROSTATE
Anesthesia: General

## 2023-08-26 MED ORDER — OXYCODONE HCL 5 MG/5ML PO SOLN: 5.0000 mg | Freq: Once | ORAL | Status: DC | PRN

## 2023-08-26 MED ORDER — LACTATED RINGERS IV SOLN: INTRAVENOUS | Status: DC | PRN

## 2023-08-26 MED ORDER — OXYCODONE HCL 5 MG PO TABS: 5.0000 mg | ORAL_TABLET | Freq: Once | ORAL | Status: DC | PRN

## 2023-08-26 MED ORDER — LIDOCAINE 2% (20 MG/ML) 5 ML SYRINGE
INTRAMUSCULAR | Status: DC | PRN
  Administered 2023-08-26: 60 mg via INTRAVENOUS

## 2023-08-26 MED ORDER — FENTANYL CITRATE (PF) 100 MCG/2ML IJ SOLN
INTRAMUSCULAR | Status: AC
Start: 2023-08-26 — End: 2023-08-26
  Filled 2023-08-26: qty 2

## 2023-08-26 MED ORDER — ORAL CARE MOUTH RINSE: 15.0000 mL | Freq: Once | OROMUCOSAL | Status: DC

## 2023-08-26 MED ORDER — LACTATED RINGERS IV SOLN: INTRAVENOUS | Status: DC

## 2023-08-26 MED ORDER — FENTANYL CITRATE PF 50 MCG/ML IJ SOSY: 25.0000 ug | PREFILLED_SYRINGE | INTRAMUSCULAR | Status: DC | PRN

## 2023-08-26 MED ORDER — CHLORHEXIDINE GLUCONATE 0.12 % MT SOLN: 15.0000 mL | Freq: Once | OROMUCOSAL | Status: DC

## 2023-08-26 MED ORDER — ONDANSETRON HCL 4 MG/2ML IJ SOLN: 4.0000 mg | Freq: Once | INTRAMUSCULAR | Status: DC | PRN

## 2023-08-26 MED ORDER — PROPOFOL 10 MG/ML IV BOLUS
INTRAVENOUS | Status: DC | PRN
  Administered 2023-08-26: 200 mg via INTRAVENOUS

## 2023-08-26 MED ORDER — FENTANYL CITRATE (PF) 100 MCG/2ML IJ SOLN
INTRAMUSCULAR | Status: DC | PRN
  Administered 2023-08-26: 50 ug via INTRAVENOUS

## 2023-08-26 NOTE — Op Note (Signed)
 Pre op diagnosis: Elevated PSA  Post op diagnosis: Elevated PSA        Procedure: Transrectal ultrasound of the prostate, Ultrasound Guided Prostate needle biopsy.   Attending: Belvie Clara  Anesthesia: General  EBL: minimal  Antibiotics: Gentamicin   Drains: none  Findings: 60.47g prostate with prominent right lobe. Prostate measurements: width 5.4cm, height 4.7cm, length 4.6cm ho hypoechoic or hyperechoic lesions   Indications: Pt is a 63yo male with a history of elevated PSA and abnormal DRE. After discussing workup he has elected to proceed with prostate biopsy  Procedure in detail: Prior to procedure consent was obtained. The patient was brought to the OR and a brief timeout was done to ensure correct patient, correct procedure, and correct site. General anesthesia was administered and patient was placed in the dorsal lithotomy position. The prostate size was estimated to be 40g by digital rectal exam. The 10 MHz transrectal ultrasound probe was placed into the rectum. Prostate width measured 5.4cm, height of 4.7cm and length of 4.6cm . Prostate volume was measured to be 60.47 cc. The biopsy cores were obtained using direct, real-time ultrasound guidance utilizing a standard 14core pattern with one core from the right apex lateral using real time ultrasound guidance to direct the biopsy core being taken from this location, right apex medial using real time ultrasound guidance to direct the biopsy core being taken from this location, left apex lateral using real time ultrasound guidance to direct the biopsy core being taken from this location, left apex medial using real time ultrasound guidance to direct the biopsy core being taken from this location, 2 right mid lateral using real time ultrasound guidance to direct the biopsy core being taken from this location, 2 right mid medial using real time ultrasound guidance to direct the biopsy core being taken from this location, left mid  lateral using real time ultrasound guidance to direct the biopsy core being taken from this location, left mid medial using real time ultrasound guidance to direct the biopsy core being taken from this location, right base lateral using real time ultrasound guidance to direct the biopsy core being taken from this location, right base medial using real time ultrasound guidance to direct the biopsy core being taken from this location, left base lateral using real time ultrasound guidance to direct the biopsy core being taken from this location and left base medial of the prostate using real time ultrasound guidance to direct the biopsy core being taken from this location. The biopsy cores were placed in buffered formalin and sent to pathology. This then concluded the procedure which was well tolerated by the patient.   Complications:. None.   Condition: Stable, extubated, transferred to PACU  Plan: Patient is to be discharged home. They are to followup in 1 week for pathology discussion

## 2023-08-26 NOTE — Progress Notes (Signed)
 Pt called and told nurse he is on his way

## 2023-08-26 NOTE — Anesthesia Procedure Notes (Signed)
 Procedure Name: LMA Insertion Date/Time: 08/26/2023 9:27 AM  Performed by: Barbarann Verneita RAMAN, CRNAPre-anesthesia Checklist: Patient identified, Patient being monitored, Emergency Drugs available, Timeout performed and Suction available Patient Re-evaluated:Patient Re-evaluated prior to induction Oxygen Delivery Method: Circle System Utilized Preoxygenation: Pre-oxygenation with 100% oxygen Induction Type: IV induction Ventilation: Mask ventilation without difficulty LMA: LMA inserted LMA Size: 4.0 Number of attempts: 1 Placement Confirmation: positive ETCO2 and breath sounds checked- equal and bilateral Tube secured with: Tape Dental Injury: Teeth and Oropharynx as per pre-operative assessment

## 2023-08-26 NOTE — Transfer of Care (Signed)
 Immediate Anesthesia Transfer of Care Note  Patient: Mark Waller  Procedure(s) Performed: BIOPSY, PROSTATE  Patient Location: PACU  Anesthesia Type:General  Level of Consciousness: drowsy and patient cooperative  Airway & Oxygen Therapy: Patient Spontanous Breathing  Post-op Assessment: Report given to RN and Post -op Vital signs reviewed and stable  Post vital signs: Reviewed and stable  Last Vitals:  Vitals Value Taken Time  BP 136/58 08/26/23 09:56  Temp 98.6 08/26/23  0959  Pulse 50 08/26/23 09:58  Resp 16 08/26/23 09:58  SpO2 95 % 08/26/23 09:58  Vitals shown include unfiled device data.  Last Pain:  Vitals:   08/26/23 0749  TempSrc: Oral  PainSc: 0-No pain         Complications: No notable events documented.

## 2023-08-26 NOTE — Anesthesia Preprocedure Evaluation (Signed)
 Anesthesia Evaluation  Patient identified by MRN, date of birth, ID band Patient awake    Reviewed: Allergy & Precautions, H&P , NPO status , Patient's Chart, lab work & pertinent test results, reviewed documented beta blocker date and time   Airway Mallampati: II  TM Distance: >3 FB Neck ROM: full    Dental no notable dental hx.    Pulmonary neg pulmonary ROS, shortness of breath, sleep apnea , pneumonia, COPD, Current Smoker and Patient abstained from smoking.   Pulmonary exam normal breath sounds clear to auscultation       Cardiovascular Exercise Tolerance: Good hypertension, + CAD, + Peripheral Vascular Disease and + DOE  negative cardio ROS + dysrhythmias  Rhythm:regular Rate:Normal     Neuro/Psych  PSYCHIATRIC DISORDERS Anxiety     CVA negative neurological ROS  negative psych ROS   GI/Hepatic negative GI ROS, Neg liver ROS,GERD  ,,  Endo/Other  negative endocrine ROS    Renal/GU Renal diseasenegative Renal ROS  negative genitourinary   Musculoskeletal   Abdominal   Peds  Hematology negative hematology ROS (+)   Anesthesia Other Findings   Reproductive/Obstetrics negative OB ROS                              Anesthesia Physical Anesthesia Plan  ASA: 3  Anesthesia Plan: General and General LMA   Post-op Pain Management:    Induction:   PONV Risk Score and Plan: Ondansetron   Airway Management Planned:   Additional Equipment:   Intra-op Plan:   Post-operative Plan:   Informed Consent: I have reviewed the patients History and Physical, chart, labs and discussed the procedure including the risks, benefits and alternatives for the proposed anesthesia with the patient or authorized representative who has indicated his/her understanding and acceptance.     Dental Advisory Given  Plan Discussed with: CRNA  Anesthesia Plan Comments:         Anesthesia Quick  Evaluation
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
## 2023-08-27 ENCOUNTER — Encounter (HOSPITAL_COMMUNITY): Payer: Self-pay | Admitting: Urology

## 2023-08-27 ENCOUNTER — Ambulatory Visit: Payer: Self-pay

## 2023-08-27 LAB — SURGICAL PATHOLOGY

## 2023-09-02 NOTE — Anesthesia Postprocedure Evaluation (Signed)
 Anesthesia Post Note  Patient: OSIRIS ODRISCOLL  Procedure(s) Performed: BIOPSY, PROSTATE  Patient location during evaluation: Phase II Anesthesia Type: General Level of consciousness: awake Pain management: pain level controlled Vital Signs Assessment: post-procedure vital signs reviewed and stable Respiratory status: spontaneous breathing and respiratory function stable Cardiovascular status: blood pressure returned to baseline and stable Postop Assessment: no headache and no apparent nausea or vomiting Anesthetic complications: no Comments: Late entry   No notable events documented.   Last Vitals:  Vitals:   08/26/23 1015 08/26/23 1030  BP: (!) 120/58 125/78  Pulse: (!) 47 (!) 44  Resp: 15 15  Temp:  36.6 C  SpO2: 96% 96%    Last Pain:  Vitals:   08/27/23 1336  TempSrc:   PainSc: 0-No pain                 Yvonna JINNY Bosworth

## 2023-09-07 NOTE — Telephone Encounter (Signed)
 Called pt to reschedule appointment for 3:10 on 09/10 unable to lvm pt appointment time changed

## 2023-09-07 NOTE — Telephone Encounter (Signed)
-----   Message from Belvie Clara sent at 09/07/2023  8:02 AM EDT ----- He needs to see me to discuss biopsy results ----- Message ----- From: Sammie Exie HERO, CMA Sent: 08/30/2023   4:16 PM EDT To: Belvie LITTIE Clara, MD  Please review.  ----- Message ----- From: Interface, Lab In Three Zero One Sent: 08/27/2023   9:38 AM EDT To: [facility]

## 2023-09-13 ENCOUNTER — Ambulatory Visit: Admitting: Urology

## 2023-09-15 ENCOUNTER — Ambulatory Visit (INDEPENDENT_AMBULATORY_CARE_PROVIDER_SITE_OTHER): Admitting: Urology

## 2023-09-15 ENCOUNTER — Ambulatory Visit: Admitting: Urology

## 2023-09-15 ENCOUNTER — Encounter: Payer: Self-pay | Admitting: Urology

## 2023-09-15 DIAGNOSIS — C61 Malignant neoplasm of prostate: Secondary | ICD-10-CM | POA: Diagnosis not present

## 2023-09-15 NOTE — Patient Instructions (Signed)
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
## 2023-09-21 ENCOUNTER — Encounter (HOSPITAL_COMMUNITY)

## 2023-09-23 ENCOUNTER — Ambulatory Visit (HOSPITAL_COMMUNITY): Admission: RE | Admit: 2023-09-23 | Source: Home / Self Care | Admitting: Urology

## 2023-09-23 SURGERY — CYSTOSCOPY, WITH RETROGRADE PYELOGRAM
Anesthesia: General | Laterality: Bilateral

## 2023-09-24 ENCOUNTER — Telehealth: Payer: Self-pay

## 2023-09-24 NOTE — Telephone Encounter (Signed)
 [place] w/ [facility] [phone]  Checking on status of PSMA being approved and scheduled.  Please advise.

## 2023-09-24 NOTE — Telephone Encounter (Signed)
 Returned call to [place] and [facility] Reviewed latest update through [facility]- PSMA still pending.

## 2023-09-24 NOTE — Telephone Encounter (Signed)
 I will review PSMA pending approval Monday- and return call to [place] for another update

## 2023-09-28 NOTE — Telephone Encounter (Signed)
 PSMA Denied. Sent denial to MD for advice- Notified [place] and [facility] to relay message to [place] who is unavailable.

## 2023-10-01 DIAGNOSIS — R55 Syncope and collapse: Secondary | ICD-10-CM | POA: Diagnosis not present

## 2023-10-01 DIAGNOSIS — J449 Chronic obstructive pulmonary disease, unspecified: Secondary | ICD-10-CM | POA: Diagnosis not present

## 2023-10-01 DIAGNOSIS — R42 Dizziness and giddiness: Secondary | ICD-10-CM | POA: Diagnosis not present

## 2023-10-01 DIAGNOSIS — I480 Paroxysmal atrial fibrillation: Secondary | ICD-10-CM | POA: Diagnosis not present

## 2023-10-03 DIAGNOSIS — Z880 Allergy status to penicillin: Secondary | ICD-10-CM | POA: Diagnosis not present

## 2023-10-03 DIAGNOSIS — I252 Old myocardial infarction: Secondary | ICD-10-CM | POA: Diagnosis not present

## 2023-10-03 DIAGNOSIS — N390 Urinary tract infection, site not specified: Secondary | ICD-10-CM | POA: Diagnosis not present

## 2023-10-03 DIAGNOSIS — E860 Dehydration: Secondary | ICD-10-CM | POA: Diagnosis not present

## 2023-10-03 DIAGNOSIS — Z7951 Long term (current) use of inhaled steroids: Secondary | ICD-10-CM | POA: Diagnosis not present

## 2023-10-03 DIAGNOSIS — N281 Cyst of kidney, acquired: Secondary | ICD-10-CM | POA: Diagnosis not present

## 2023-10-03 DIAGNOSIS — Z885 Allergy status to narcotic agent status: Secondary | ICD-10-CM | POA: Diagnosis not present

## 2023-10-03 DIAGNOSIS — R0602 Shortness of breath: Secondary | ICD-10-CM | POA: Diagnosis not present

## 2023-10-03 DIAGNOSIS — N183 Chronic kidney disease, stage 3 unspecified: Secondary | ICD-10-CM | POA: Diagnosis not present

## 2023-10-03 DIAGNOSIS — A4150 Gram-negative sepsis, unspecified: Secondary | ICD-10-CM | POA: Diagnosis not present

## 2023-10-03 DIAGNOSIS — R079 Chest pain, unspecified: Secondary | ICD-10-CM | POA: Diagnosis not present

## 2023-10-03 DIAGNOSIS — E871 Hypo-osmolality and hyponatremia: Secondary | ICD-10-CM | POA: Diagnosis not present

## 2023-10-03 DIAGNOSIS — R0689 Other abnormalities of breathing: Secondary | ICD-10-CM | POA: Diagnosis not present

## 2023-10-03 DIAGNOSIS — G4733 Obstructive sleep apnea (adult) (pediatric): Secondary | ICD-10-CM | POA: Diagnosis not present

## 2023-10-03 DIAGNOSIS — R1084 Generalized abdominal pain: Secondary | ICD-10-CM | POA: Diagnosis not present

## 2023-10-03 DIAGNOSIS — N179 Acute kidney failure, unspecified: Secondary | ICD-10-CM | POA: Diagnosis not present

## 2023-10-03 DIAGNOSIS — N3000 Acute cystitis without hematuria: Secondary | ICD-10-CM | POA: Diagnosis not present

## 2023-10-03 DIAGNOSIS — I499 Cardiac arrhythmia, unspecified: Secondary | ICD-10-CM | POA: Diagnosis not present

## 2023-10-03 DIAGNOSIS — F17210 Nicotine dependence, cigarettes, uncomplicated: Secondary | ICD-10-CM | POA: Diagnosis not present

## 2023-10-03 DIAGNOSIS — K8020 Calculus of gallbladder without cholecystitis without obstruction: Secondary | ICD-10-CM | POA: Diagnosis not present

## 2023-10-03 DIAGNOSIS — Z743 Need for continuous supervision: Secondary | ICD-10-CM | POA: Diagnosis not present

## 2023-10-03 DIAGNOSIS — K219 Gastro-esophageal reflux disease without esophagitis: Secondary | ICD-10-CM | POA: Diagnosis not present

## 2023-10-03 DIAGNOSIS — I2510 Atherosclerotic heart disease of native coronary artery without angina pectoris: Secondary | ICD-10-CM | POA: Diagnosis not present

## 2023-10-03 DIAGNOSIS — I129 Hypertensive chronic kidney disease with stage 1 through stage 4 chronic kidney disease, or unspecified chronic kidney disease: Secondary | ICD-10-CM | POA: Diagnosis not present

## 2023-10-03 DIAGNOSIS — R7401 Elevation of levels of liver transaminase levels: Secondary | ICD-10-CM | POA: Diagnosis not present

## 2023-10-03 DIAGNOSIS — K5900 Constipation, unspecified: Secondary | ICD-10-CM | POA: Diagnosis not present

## 2023-10-03 DIAGNOSIS — E872 Acidosis, unspecified: Secondary | ICD-10-CM | POA: Diagnosis not present

## 2023-10-03 DIAGNOSIS — R6520 Severe sepsis without septic shock: Secondary | ICD-10-CM | POA: Diagnosis not present

## 2023-10-03 DIAGNOSIS — J449 Chronic obstructive pulmonary disease, unspecified: Secondary | ICD-10-CM | POA: Diagnosis not present

## 2023-10-03 DIAGNOSIS — Z8673 Personal history of transient ischemic attack (TIA), and cerebral infarction without residual deficits: Secondary | ICD-10-CM | POA: Diagnosis not present

## 2023-10-03 DIAGNOSIS — E785 Hyperlipidemia, unspecified: Secondary | ICD-10-CM | POA: Diagnosis not present

## 2023-10-03 DIAGNOSIS — A419 Sepsis, unspecified organism: Secondary | ICD-10-CM | POA: Diagnosis not present

## 2023-10-03 DIAGNOSIS — Z79899 Other long term (current) drug therapy: Secondary | ICD-10-CM | POA: Diagnosis not present

## 2023-10-03 DIAGNOSIS — R918 Other nonspecific abnormal finding of lung field: Secondary | ICD-10-CM | POA: Diagnosis not present

## 2023-10-03 DIAGNOSIS — R7989 Other specified abnormal findings of blood chemistry: Secondary | ICD-10-CM | POA: Diagnosis not present

## 2023-10-04 DIAGNOSIS — A419 Sepsis, unspecified organism: Secondary | ICD-10-CM | POA: Diagnosis not present

## 2023-10-04 DIAGNOSIS — E872 Acidosis, unspecified: Secondary | ICD-10-CM | POA: Diagnosis not present

## 2023-10-04 DIAGNOSIS — E871 Hypo-osmolality and hyponatremia: Secondary | ICD-10-CM | POA: Diagnosis not present

## 2023-10-04 DIAGNOSIS — N390 Urinary tract infection, site not specified: Secondary | ICD-10-CM | POA: Diagnosis not present

## 2023-10-05 DIAGNOSIS — E871 Hypo-osmolality and hyponatremia: Secondary | ICD-10-CM | POA: Diagnosis not present

## 2023-10-05 DIAGNOSIS — J449 Chronic obstructive pulmonary disease, unspecified: Secondary | ICD-10-CM | POA: Diagnosis not present

## 2023-10-05 DIAGNOSIS — E872 Acidosis, unspecified: Secondary | ICD-10-CM | POA: Diagnosis not present

## 2023-10-05 DIAGNOSIS — A419 Sepsis, unspecified organism: Secondary | ICD-10-CM | POA: Diagnosis not present

## 2023-10-05 DIAGNOSIS — G4733 Obstructive sleep apnea (adult) (pediatric): Secondary | ICD-10-CM | POA: Diagnosis not present

## 2023-10-05 DIAGNOSIS — K5900 Constipation, unspecified: Secondary | ICD-10-CM | POA: Diagnosis not present

## 2023-10-05 DIAGNOSIS — K8020 Calculus of gallbladder without cholecystitis without obstruction: Secondary | ICD-10-CM | POA: Diagnosis not present

## 2023-10-05 DIAGNOSIS — N390 Urinary tract infection, site not specified: Secondary | ICD-10-CM | POA: Diagnosis not present

## 2023-10-05 DIAGNOSIS — R7989 Other specified abnormal findings of blood chemistry: Secondary | ICD-10-CM | POA: Diagnosis not present

## 2023-10-05 DIAGNOSIS — N179 Acute kidney failure, unspecified: Secondary | ICD-10-CM | POA: Diagnosis not present

## 2023-10-05 DIAGNOSIS — N183 Chronic kidney disease, stage 3 unspecified: Secondary | ICD-10-CM | POA: Diagnosis not present

## 2023-10-06 NOTE — Telephone Encounter (Signed)
 PSMA approval received.  Notified [place] with [facility]- patient is currently admitted at [facility]

## 2023-10-25 ENCOUNTER — Encounter: Payer: Self-pay | Admitting: Internal Medicine

## 2023-10-25 ENCOUNTER — Ambulatory Visit: Admitting: Internal Medicine

## 2023-10-25 DIAGNOSIS — J449 Chronic obstructive pulmonary disease, unspecified: Secondary | ICD-10-CM

## 2023-10-25 DIAGNOSIS — R911 Solitary pulmonary nodule: Secondary | ICD-10-CM
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
## 2023-10-26 NOTE — Telephone Encounter (Signed)
 Radiology appt 10/23

## 2023-10-28 ENCOUNTER — Ambulatory Visit (HOSPITAL_COMMUNITY): Admission: RE | Admit: 2023-10-28 | Source: Ambulatory Visit

## 2023-10-29 DIAGNOSIS — R779 Abnormality of plasma protein, unspecified: Secondary | ICD-10-CM | POA: Diagnosis not present

## 2023-10-29 DIAGNOSIS — R7401 Elevation of levels of liver transaminase levels: Secondary | ICD-10-CM | POA: Diagnosis not present

## 2023-10-29 DIAGNOSIS — R413 Other amnesia: Secondary | ICD-10-CM | POA: Diagnosis not present

## 2023-11-11 ENCOUNTER — Ambulatory Visit (HOSPITAL_COMMUNITY)
Admission: RE | Admit: 2023-11-11 | Discharge: 2023-11-11 | Disposition: A | Discharge status: Home / Self Care | Source: Ambulatory Visit | Attending: Urology | Admitting: Urology

## 2023-11-11 DIAGNOSIS — R918 Other nonspecific abnormal finding of lung field: Secondary | ICD-10-CM | POA: Diagnosis not present

## 2023-11-11 DIAGNOSIS — J439 Emphysema, unspecified: Secondary | ICD-10-CM | POA: Insufficient documentation

## 2023-11-11 DIAGNOSIS — C61 Malignant neoplasm of prostate: Secondary | ICD-10-CM | POA: Insufficient documentation

## 2023-11-11 MED ORDER — FLOTUFOLASTAT F 18 GALLIUM 296-5846 MBQ/ML IV SOLN
8.6800 | Freq: Once | INTRAVENOUS | Status: AC
  Administered 2023-11-11: 8.68 via INTRAVENOUS
  Filled 2023-11-11: qty 9

## 2023-11-23 NOTE — Progress Notes (Signed)
 [facility], [facility]  Hematology Oncology Consultation  Initial Visit Note  Patient Name: Mark Waller Patient Age: 63 y.o. Encounter Date: 11/25/2023  Referring Physician:  Dannielle Alm Locus, MD [address] [place],  [place] [address]  Primary Care Provider: Marlee Lynwood Benders, MD  Consulting Provider: Mertie Been Letty Raddle., MD  Hematology/Oncology  Reason for visit: Evaluation Prostate Cancer.   Assessment/Plan: Mr. Spielmann is a 63 year-old gentleman recently diagnosed with Clinical Stage IIIC (cT3b, cN0, cM0, PSA 19.2 ng/mL, Grade Group 5) Prostate Cancer. He has very high-risk disease.  He will be treated with RT and ADT. His Urologist (Dr. Sherrilee) will be giving him the GnRH antagonist. There is some evidence that such medications have less cardiovascular complications. The options are Degarelix (given subcutaneously monthly) or Relugolix  (given orally). I discussed with him about the use of the oral anti-androgen called Abiraterone. His Hepatitis C on treatment or his liver cirrhosis are not absolute contraindications for it. He is in agreement with the plan. He will be closely monitored for toxicity.  He also has a history of bladder cancer (Stage 0is) diagnosed and treated in 2020. This is followed by his Urologist.    Cancer Staging <redacted file path>  Malignant neoplasm of urinary bladder (CMS-HCC) Staging form: Urinary Bladder, AJCC 8th Edition - Clinical stage from 08/29/2018: Stage 0is (cTis, cN0, cM0) - Signed by Dannielle Alm Locus, MD on 11/19/2023  Prostate cancer    (CMS-HCC) Staging form: Prostate, AJCC 8th Edition - Clinical stage from 11/11/2023: Stage IIIC (cT3b, cN0, cM0, PSA: 19.2, Grade Group: 5) - Signed by Dannielle Alm Locus, MD on 11/19/2023   I have reviewed the laboratory, pathology, and radiology reports in detail and discussed findings with the patient.  History of Present Illness:   Mark Waller is a  63 y.o. male who is seen in consultation at the request of Morris, Alm Locus, MD for an evaluation of Prostate Cancer.  He was diagnosed with Clinical Stage IIIC (cT3b, cN0, cM0, PSA 19.2 ng/mL, Grade Group 5) Prostate Cancer in August 2025. PSMA-PET did not show any systemic disease.  He was evaluated by Dr. Dannielle (RT) on 11/19/2023. The plan is to treat him with RT and ADT.   He also has a history of bladder cancer (Stage 0is) diagnosed and treated in 2020.  Past Medical History[1]  Past Surgical History[2]   Family History[3]  Family Status  Relation Name Status  . Mother  Deceased at age 44  . Father  Deceased at age 46  . Sister  Alive  . Sister  Alive  . Brother  Deceased  . MGM  Deceased  . MGF  Deceased  . PGM  Deceased  . PGF  Deceased  . Daughter  Alive  . Daughter  Alive  . Daughter  Alive  No partnership data on file   Social History   Occupational History  . Not on file  Tobacco Use  . Smoking status: Every Day    Current packs/day: 0.50    Average packs/day: 1 pack/day for 50.9 years (49.9 ttl pk-yrs)    Types: Cigarettes    Start date: 1975    Passive exposure: Current  . Smokeless tobacco: Never  Vaping Use  . Vaping status: Never Used  Substance and Sexual Activity  . Alcohol use: Not Currently    Alcohol/week: 12.0 standard drinks of alcohol    Types: 12 Cans of beer per week  Comment: weekly drinks a 12 pack  . Drug use: No  . Sexual activity: Not Currently    Partners: Female    Allergies[4]  Current Medications[5]  Review of Systems  Constitutional: Negative.   HENT:  Negative.    Eyes: Negative.   Respiratory: Negative.    Cardiovascular: Negative.   Gastrointestinal: Negative.   Endocrine: Negative.   Genitourinary: Negative.    Musculoskeletal: Negative.   Skin: Negative.   Neurological: Negative.   Hematological: Negative.      Vital Signs for this encounter: BSA: 2.17 meters squared BP 170/82   Pulse (!) 47    Temp 36.9 C (98.4 F) (Temporal)   Resp 18   Ht 193 cm (6' 3.98)   Wt 87.5 kg (192 lb 14.4 oz)   SpO2 98%   BMI 23.49 kg/m   Physical Exam Constitutional:      Appearance: Normal appearance.  HENT:     Head: Normocephalic.     Nose: Nose normal.     Mouth/Throat:     Mouth: Mucous membranes are moist.     Pharynx: Oropharynx is clear.  Eyes:     Conjunctiva/sclera: Conjunctivae normal.     Pupils: Pupils are equal, round, and reactive to light.  Pulmonary:     Breath sounds: Normal breath sounds.  Abdominal:     Palpations: Abdomen is soft.  Musculoskeletal:        General: Normal range of motion.     Cervical back: Neck supple.  Skin:    General: Skin is warm.  Neurological:     General: No focal deficit present.     Mental Status: He is alert.     Karnofsky/Lansky Performance Status 80, Normal activity with effort; some signs or symptoms of disease (ECOG equivalent 1)  Results:  WBC  Date Value Ref Range Status  10/29/2023 6.6 4.0 - 10.5 10*9/L Final  01/13/2021 7.0 3.4 - 10.8 x10E3/uL Final   HGB  Date Value Ref Range Status  10/29/2023 15.4 12.5 - 17.0 g/dL Final  98/90/7976 84.5 13.0 - 17.7 g/dL Final   HCT  Date Value Ref Range Status  10/29/2023 48.2 36.0 - 50.0 % Final  01/13/2021 43.4 37.5 - 51.0 % Final   Platelet  Date Value Ref Range Status  10/29/2023 262 140 - 415 10*9/L Final  01/13/2021 262 150 - 450 x10E3/uL Final   Creatinine  Date Value Ref Range Status  10/07/2023 0.85 0.80 - 1.30 mg/dL Final  98/90/7976 9.22 0.76 - 1.27 mg/dL Final   AST  Date Value Ref Range Status  10/07/2023 106 (H) 15 - 40 U/L Final  01/13/2021 218 (H) 0 - 40 IU/L Final   I spent 60 minutes reviewing the records and face to face with the patient today.  Mertie Azure, MD          [1] Past Medical History: Diagnosis Date  . Acute pain of left shoulder 04/19/2017  . Allergic rhinitis   . Anemia   . Benign essential hypertension    x yrs.,  chronic leg edema, venous doppler with incompetent veins L>R 4/04; repeat venous doppler 10/07 neg for DVT  . Bladder cancer    (CMS-HCC)   . BMI 40.0-44.9, adult (CMS-HCC)   . Cancer    (CMS-HCC) 09/2018   bladder caner  . Chest pain 12/18/2013  . Chronic kidney disease, stage III (moderate) (CMS-HCC)   . Chronic sinusitis   . COPD (chronic obstructive pulmonary disease) (CMS-HCC)   .  Coronary atherosclerosis of native coronary artery   . DDD (degenerative disc disease), lumbar   . Dependence on other enabling machines and devices   . GERD (gastroesophageal reflux disease)   . Headache    normal brain MRI 2/03  . History of stroke   . Hyperlipidemia   . Localized primary osteoarthritis of left lower leg   . Myocardial infarction (CMS-HCC)   . Neck pain   . OSA (obstructive sleep apnea) 11/2009   OSA with CPAP nasal mask  . Pain in left forearm   . Pain of left shoulder joint on movement   . Prostate cancer    (CMS-HCC)   . Right leg pain 09/06/2021  . Status post total left knee replacement   . Stroke    (CMS-HCC)   . Thoracic spondyloarthritis   [2] Past Surgical History: Procedure Laterality Date  . CARDIAC CATHETERIZATION  2009  . COLONOSCOPY W/ POLYPECTOMY  2013  . HEMILAMINOTOMY LUMBAR SPINE  2/04/, 5/05   Right L5 hemilaminectomy and synovial cyst removal, repeat surg L4-5, L5-S1;  . HERNIA REPAIR    . HNP discectomy Left 02/25/2010   L2-L3  . KNEE ARTHROSCOPY Right 06/12/2011  . LIPOMA RESECTION     on back  . PNEUMOTHORAX LEFT TUBE PLACEMENT [facility] HISTORICAL RESULT)  late 70's  . PROSTATE BIOPSY    . SHOULDER ARTHROSCOPY Left 01/09/2016  . SHOULDER ARTHROSCOPY Left 02/22/2017   shoulder scope with clavicle resection   . TOTAL KNEE ARTHROPLASTY Right 08/09/2012  . TOTAL KNEE ARTHROPLASTY Left 09/18/2014  . TRANSURETHRAL RESECTION OF BLADDER  08/29/2018  [3] Family History Problem Relation Age of Onset  . No Known Problems Mother   . Heart disease Father    . Coronary artery disease Father   . Hypertension Father   . Stroke Sister   . Heart attack Brother   . Stroke Brother   . Diabetes Brother   . Crohn's disease Daughter   [4] Allergies Allergen Reactions  . Levaquin [Levofloxacin] Swelling  . Codeine Itching  . Hydrocodone Nausea Only  . Penicillins Rash  . Tramadol Itching and Rash  [5] Current Outpatient Medications  Medication Sig Dispense Refill  . abiraterone (ZYTIGA) 500 mg tablet Take 2 tablets (1,000 mg total) by mouth daily. 60 tablet 3  . albuterol  HFA 90 mcg/actuation inhaler Inhale 2 puffs every six (6) hours as needed for wheezing or shortness of breath. 8 g 6  . fluticasone propionate (FLONASE) 50 mcg/actuation nasal spray Use 2 spray(s) in each nostril once daily 16 g 3  . glecaprevir-pibrentasvir (MAVYRET) 100-40 mg tablet Take 3 tablets by mouth daily. Take with food. 2 4-week carton 0  . nitroglycerin  (NITROSTAT ) 0.3 MG SL tablet Place 1 tablet (0.3 mg total) under the tongue every five (5) minutes as needed for chest pain. 90 tablet 0  . olmesartan  (BENICAR ) 20 MG tablet Take 1 tablet (20 mg total) by mouth daily. 100 tablet 3  . omeprazole (PRILOSEC) 40 MG capsule Take 1 capsule by mouth once daily 90 capsule 3  . predniSONE (DELTASONE) 5 MG tablet Take 1 tablet (5 mg total) by mouth daily. 30 tablet 3  . rosuvastatin  (CRESTOR ) 10 MG tablet Take 1 tablet (10 mg total) by mouth nightly. 60 tablet 0  . rosuvastatin  (CRESTOR ) 40 MG tablet Take 1 tablet (40 mg total) by mouth nightly. 90 tablet 3  . SPIRIVA  WITH HANDIHALER 18 mcg inhalation capsule Place 1 capsule (18 mcg total) into inhaler and inhale  in the morning. 30 capsule 6  . tamsulosin  (FLOMAX ) 0.4 mg capsule Take 1 capsule (0.4 mg total) by mouth daily.     No current facility-administered medications for this visit.

## 2023-11-26 ENCOUNTER — Ambulatory Visit: Admitting: Urology

## 2023-11-26 ENCOUNTER — Encounter: Payer: Self-pay | Admitting: Urology

## 2023-11-26 VITALS — BP 163/64 | HR 50

## 2023-11-26 DIAGNOSIS — C679 Malignant neoplasm of bladder, unspecified: Secondary | ICD-10-CM

## 2023-11-26 DIAGNOSIS — C61 Malignant neoplasm of prostate: Secondary | ICD-10-CM | POA: Diagnosis not present

## 2023-11-26 MED ORDER — ORGOVYX 120 MG PO TABS: 120.0000 mg | ORAL_TABLET | Freq: Every day | ORAL | 11 refills | Status: AC

## 2023-11-26 NOTE — Patient Instructions (Signed)
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
## 2023-11-29 ENCOUNTER — Telehealth: Payer: Self-pay

## 2023-11-29 NOTE — Telephone Encounter (Signed)
 faxed

## 2023-11-29 NOTE — Telephone Encounter (Signed)
 PA submitted and approved through 01/04/25. Approval faxed to [employer]

## 2023-12-09 ENCOUNTER — Ambulatory Visit (HOSPITAL_COMMUNITY)
Admission: RE | Admit: 2023-12-09 | Discharge: 2023-12-09 | Disposition: A | Source: Ambulatory Visit | Attending: Internal Medicine | Admitting: Internal Medicine

## 2023-12-09 ENCOUNTER — Ambulatory Visit: Admitting: Internal Medicine

## 2023-12-09 ENCOUNTER — Encounter: Payer: Self-pay | Admitting: Internal Medicine

## 2023-12-09 VITALS — BP 177/71 | HR 52 | Ht 72.0 in | Wt 204.0 lb

## 2023-12-09 DIAGNOSIS — F17210 Nicotine dependence, cigarettes, uncomplicated: Secondary | ICD-10-CM | POA: Diagnosis not present

## 2023-12-09 DIAGNOSIS — J449 Chronic obstructive pulmonary disease, unspecified: Secondary | ICD-10-CM

## 2023-12-09 DIAGNOSIS — I10 Essential (primary) hypertension: Secondary | ICD-10-CM

## 2023-12-09 MED ORDER — OLMESARTAN MEDOXOMIL 40 MG PO TABS: 40.0000 mg | ORAL_TABLET | Freq: Every day | ORAL | 11 refills | Status: AC

## 2023-12-09 MED ORDER — METHYLPREDNISOLONE ACETATE 80 MG/ML IJ SUSP
120.0000 mg | Freq: Once | INTRAMUSCULAR | Status: AC
  Administered 2023-12-09: 120 mg via INTRAMUSCULAR

## 2023-12-09 NOTE — Assessment & Plan Note (Addendum)
 Active smoker  - 01/19/2023  After extensive coaching inhaler device,  effectiveness =    90% with SMI > change to spiriva  smi 2 q am if urine flow tolerates  -  01/19/2023   Walked on RA  x  3  lap(s) =  approx 450  ft  @ mod pace, stopped due to end of study  with lowest 02 sats 98% and no sob /cp  - PFT's  07/13/23  FEV1 3.29 (86 % ) ratio 0.64  p 2 % improvement from saba p spiriva  prior to study with DLCO  16.5 (58%)   and FV curve mild concavity    - D/c spiriva  dpi with main cc = dry upper airway cough  - 12/09/2023  After extensive coaching inhaler device,  effectiveness =    75% hfa   12/09/2023 rx depomedrol for cough ? Allergy/ ? Asthma component   Will just use prn saba hfa for now: Re SABA :  I spent extra time with pt today reviewing appropriate use of albuterol  for prn use on exertion with the following points: 1) saba is for relief of sob that does not improve by walking a slower pace or resting but rather if the pt does not improve after trying this first. 2) If the pt is convinced, as many are, that saba helps recover from activity faster then it's easy to tell if this is the case by re-challenging : ie stop, take the inhaler, then p 5 minutes try the exact same activity (intensity of workload) that just caused the symptoms and see if they are substantially diminished or not after saba 3) if there is an activity that reproducibly causes the symptoms, try the saba 15 min before the activity on alternate days   If in fact the saba really does help, then fine to continue to use it prn but advised may need to look closer at the maintenance regimen  (spiriva  dpi vs nothing) being used to achieve better control of airways disease with exertion.

## 2023-12-09 NOTE — Patient Instructions (Addendum)
 Depomedrol 120 mg IM   Stop spiriva    Increase the olmesartan  to 40 mg one daily   Use your albuterol  as a rescue medication to be used if you can't catch your breath by resting, slowing your pace,  or doing a relaxed purse lip breathing pattern.  - The less you use it, the better it will work when you need it. - Ok to use up to 2 puffs  every 4 hours if you must but call for  appointment if use goes up over your usual need - Don't leave home without it !!  (think of it like a spare tire or starter fluid for your car)   Also  Ok to try albuterol  15 min before an activity (on alternating days)  that you know would usually make you short of breath and see if it makes any difference and if makes none then don't take albuterol  after activity unless you can't catch your breath as this means it's the resting that helps, not the albuterol .     Please remember to go to the  x-ray department  @  [facility] for your tests - we will call you with the results when they are available     Please schedule a follow up office visit in 2 weeks, sooner if needed  with all medications /inhalers/ solutions in hand so we can verify exactly what you are taking. This includes all medications from all doctors and over the counters

## 2023-12-09 NOTE — Assessment & Plan Note (Addendum)
 Counseled re importance of smoking cessation but did not meet time criteria for separate billing
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
## 2023-12-09 NOTE — Assessment & Plan Note (Addendum)
 Changed acei to benicar  20 mg daily 01/19/23 > increased to 40 mg daiily 12/09/2023 due to high bp   Comment:  Although even in retrospect it may not be clear the ACEi contributed to the pt's symptoms,  Pt improved somehwat  off them and adding them back at this point or in the future would risk confusion in interpretation of non-specific respiratory symptoms to which this patient is prone  ie  Better not to muddy the waters here.   Discussed in detail all the  indications, usual  risks and alternatives  relative to the benefits with patient who agrees to proceed with Rx as outlined.      F/u  2weeks with all meds in hand using a trust but verify approach to confirm accurate Medication  Reconciliation The principal here is that until we are certain that the  patients are doing what we've asked, it makes no sense to ask them to do more.          Each maintenance medication was reviewed in detail including emphasizing most importantly the difference between maintenance and prns and under what circumstances the prns are to be triggered using an action plan format where appropriate.  Total time for H and P, chart review, counseling, reviewing spi/ hfa device(s) and generating customized AVS unique to this office visit / same day charting = 35 min

## 2023-12-13 ENCOUNTER — Ambulatory Visit: Payer: Self-pay | Admitting: Internal Medicine

## 2023-12-13 NOTE — Progress Notes (Signed)
 Atc x1 lmtcb

## 2023-12-22 ENCOUNTER — Telehealth: Payer: Self-pay

## 2023-12-22 ENCOUNTER — Ambulatory Visit: Admitting: Internal Medicine

## 2023-12-22 ENCOUNTER — Encounter: Payer: Self-pay | Admitting: Internal Medicine

## 2023-12-22 VITALS — BP 135/58 | HR 46 | Ht 72.0 in | Wt 200.0 lb

## 2023-12-22 DIAGNOSIS — J449 Chronic obstructive pulmonary disease, unspecified: Secondary | ICD-10-CM

## 2023-12-22 DIAGNOSIS — R911 Solitary pulmonary nodule: Secondary | ICD-10-CM | POA: Diagnosis not present

## 2023-12-22 DIAGNOSIS — F17210 Nicotine dependence, cigarettes, uncomplicated: Secondary | ICD-10-CM

## 2023-12-22 MED ORDER — BREZTRI AEROSPHERE 160-9-4.8 MCG/ACT IN AERO: 2.0000 | INHALATION_SPRAY | Freq: Two times a day (BID) | RESPIRATORY_TRACT | Status: AC

## 2023-12-22 MED ORDER — BREZTRI AEROSPHERE 160-9-4.8 MCG/ACT IN AERO: 2.0000 | INHALATION_SPRAY | Freq: Two times a day (BID) | RESPIRATORY_TRACT | 11 refills | Status: AC

## 2023-12-22 MED ORDER — METHYLPREDNISOLONE ACETATE 80 MG/ML IJ SUSP
120.0000 mg | Freq: Once | INTRAMUSCULAR | Status: AC
  Administered 2023-12-22: 11:00:00 120 mg via INTRAMUSCULAR
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
## 2023-12-22 NOTE — Telephone Encounter (Signed)
 Pt called to ask what time was his surgery was scheduled for pt was advised that pre op team will call to give him further detailsa week or two prior to his surgery pt voiced his understanding

## 2023-12-22 NOTE — Patient Instructions (Addendum)
 Plan A = Automatic = Always=    Breztri  Take 2 puffs first thing in am and then another 2 puffs about 12 hours later.    Work on inhaler technique:  relax and gently blow all the way out then take a nice smooth full deep breath back in, triggering the inhaler at same time you start breathing in.  Hold breath in for at least  5 seconds if you can. Blow out breztri  thru nose. Rinse and gargle with water when done.  If mouth or throat bother you at all,  try brushing teeth/gums/tongue with arm and hammer toothpaste/ make a slurry and gargle and spit out.   Plan B = Backup (to supplement plan A, not to replace it) Use your albuterol  inhaler as a rescue medication to be used if you can't catch your breath by resting or slowing your pace  or doing a relaxed purse lip breathing pattern.  - The less you use it, the better it will work when you need it. - Ok to use the inhaler up to 2 puffs  every 4 hours if you must but call for appointment if use goes up over your usual need - Don't leave home without it !!  (think of it like the spare tire or starter fluid for your car)   For cough/ congestion > mucinex  or mucinex  dm  up to maximum of  1200 mg every 12 hours as needed  (add flutter valve next time if not better)      Depomedrol 120 mg IM   The key is to stop smoking completely before smoking completely stops you!  Please schedule a follow up office visit in 6 weeks, call sooner if needed with all medications /inhalers/ solutions in hand so we can verify exactly what you are taking. This includes all medications from all doctors and over the counters

## 2023-12-23 NOTE — Assessment & Plan Note (Addendum)
 Active smoker  - 01/19/2023  After extensive coaching inhaler device,  effectiveness =    90% with SMI > change to spiriva  smi 2 q am if urine flow tolerates  -  01/19/2023   Walked on RA  x  3  lap(s) =  approx 450  ft  @ mod pace, stopped due to end of study  with lowest 02 sats 98% and no sob /cp  - PFT's  07/13/23  FEV1 3.29 (86 % ) ratio 0.64  p 2 % improvement from saba p spiriva  prior to study with DLCO  16.5 (58%)   and FV curve mild concavity    - D/c spiriva  dpi with main cc = dry upper airway cough  - 12/22/2023  After extensive coaching inhaler device,  effectiveness =    80% hfa > change to breztri  and approp saba   Re SABA :  I spent extra time with pt today reviewing appropriate use of albuterol  for prn use on exertion with the following points: 1) saba is for relief of sob that does not improve by walking a slower pace or resting but rather if the pt does not improve after trying this first. 2) If the pt is convinced, as many are, that saba helps recover from activity faster then it's easy to tell if this is the case by re-challenging : ie stop, take the inhaler, then p 5 minutes try the exact same activity (intensity of workload) that just caused the symptoms and see if they are substantially diminished or not after saba 3) if there is an activity that reproducibly causes the symptoms, try the saba 15 min before the activity on alternate days   If in fact the saba really does help, then fine to continue to use it prn but advised may need to look closer at the maintenance regimen being used to achieve better control of airways disease with exertion.   >>> mucinex  dm 1200 mg bid / add flutter next ov if not better  >>>  f/u in 6 w with all meds in hand using a trust but verify approach to confirm accurate Medication  Reconciliation The principal here is that until we are certain that the  patients are doing what we've asked, it makes no sense to ask them to do more.

## 2023-12-23 NOTE — Assessment & Plan Note (Addendum)
 4-5 min discussion re active cigarette smoking in addition to office E&M  Ask about tobacco use:   ongoing Advise quitting   I took an extended  opportunity with this patient to outline the consequences of continued cigarette use  in airway disorders based on all the data we have from the multiple national lung health studies (perfomed over decades at millions of dollars in cost)  indicating that smoking cessation, not choice of inhalers or pulmonary physicians, is the most important aspect of his  care.   Assess willingness:  Not committed at this point Assist in quit attempt:  Per PCP when ready Arrange follow up:   Follow up per Primary Care planned

## 2023-12-23 NOTE — Assessment & Plan Note (Addendum)
=  Left upper lobe nodule 4 mm, likely inflammatory, noted on CT chest 09/2022. Needs follow up CT chest in 03/2023. - CT chest 07/24/23 Nodule 1.8 x 1.6 x 1.4 cm > PET 08/05/23 neg  -  PET 11/11/23 Stable bilateral pulmonary nodules, largest measuring 2.2 cm in the left upper lobe. These show no radiotracer accumulation. Recommend continued imaging follow-up in 6 months.  >>>   Rec resume LCS until age 63          Each maintenance medication was reviewed in detail including emphasizing most importantly the difference between maintenance and prns and under what circumstances the prns are to be triggered using an action plan format where appropriate.  Total time for H and P, chart review, counseling, reviewing hfa  device(s) and generating customized AVS unique to this office visit / same day charting = 30 min

## 2024-01-18 NOTE — Progress Notes (Signed)
 Atc x2 lmtcb sending letter

## 2024-01-19 ENCOUNTER — Encounter (HOSPITAL_COMMUNITY)
Admission: RE | Admit: 2024-01-19 | Discharge: 2024-01-19 | Disposition: A | Discharge status: Home / Self Care | Source: Ambulatory Visit | Attending: Urology | Admitting: Urology

## 2024-01-20 ENCOUNTER — Encounter (HOSPITAL_COMMUNITY): Admission: RE | Disposition: A | Payer: Self-pay | Source: Home / Self Care | Attending: Urology

## 2024-01-20 ENCOUNTER — Ambulatory Visit (HOSPITAL_COMMUNITY): Admitting: Certified Registered"

## 2024-01-20 ENCOUNTER — Ambulatory Visit (HOSPITAL_COMMUNITY): Admission: RE | Admit: 2024-01-20 | Source: Ambulatory Visit

## 2024-01-20 ENCOUNTER — Ambulatory Visit (HOSPITAL_COMMUNITY)
Admission: RE | Admit: 2024-01-20 | Discharge: 2024-01-20 | Disposition: A | Discharge status: Home / Self Care | Attending: Urology | Admitting: Urology

## 2024-01-20 DIAGNOSIS — G4730 Sleep apnea, unspecified: Secondary | ICD-10-CM | POA: Insufficient documentation

## 2024-01-20 DIAGNOSIS — D303 Benign neoplasm of bladder: Secondary | ICD-10-CM

## 2024-01-20 DIAGNOSIS — I10 Essential (primary) hypertension: Secondary | ICD-10-CM | POA: Insufficient documentation

## 2024-01-20 DIAGNOSIS — I2510 Atherosclerotic heart disease of native coronary artery without angina pectoris: Secondary | ICD-10-CM | POA: Diagnosis not present

## 2024-01-20 DIAGNOSIS — F17210 Nicotine dependence, cigarettes, uncomplicated: Secondary | ICD-10-CM | POA: Insufficient documentation

## 2024-01-20 DIAGNOSIS — N3080 Other cystitis without hematuria: Secondary | ICD-10-CM | POA: Diagnosis not present

## 2024-01-20 DIAGNOSIS — K219 Gastro-esophageal reflux disease without esophagitis: Secondary | ICD-10-CM | POA: Diagnosis not present

## 2024-01-20 DIAGNOSIS — Z08 Encounter for follow-up examination after completed treatment for malignant neoplasm: Secondary | ICD-10-CM | POA: Insufficient documentation

## 2024-01-20 DIAGNOSIS — Z8551 Personal history of malignant neoplasm of bladder: Secondary | ICD-10-CM | POA: Diagnosis not present

## 2024-01-20 DIAGNOSIS — J449 Chronic obstructive pulmonary disease, unspecified: Secondary | ICD-10-CM | POA: Diagnosis not present

## 2024-01-20 DIAGNOSIS — C679 Malignant neoplasm of bladder, unspecified: Secondary | ICD-10-CM | POA: Diagnosis present

## 2024-01-20 HISTORY — PX: TRANSURETHRAL RESECTION OF BLADDER TUMOR: SHX2575

## 2024-01-20 MED ORDER — SODIUM CHLORIDE 0.9 % IR SOLN
Status: DC | PRN
  Administered 2024-01-20: 3000 mL

## 2024-01-20 MED ORDER — OXYCODONE-ACETAMINOPHEN 5-325 MG PO TABS: 1.0000 | ORAL_TABLET | ORAL | 0 refills | Status: AC | PRN

## 2024-01-20 MED ORDER — DEXAMETHASONE SOD PHOSPHATE PF 10 MG/ML IJ SOLN
INTRAMUSCULAR | Status: DC | PRN
  Administered 2024-01-20: 10 mg via INTRAVENOUS

## 2024-01-20 MED ORDER — ORAL CARE MOUTH RINSE: 15.0000 mL | Freq: Once | OROMUCOSAL | Status: AC

## 2024-01-20 MED ORDER — FENTANYL CITRATE (PF) 50 MCG/ML IJ SOSY
25.0000 ug | PREFILLED_SYRINGE | INTRAMUSCULAR | Status: DC | PRN
  Administered 2024-01-20 (×2): 25 ug via INTRAVENOUS

## 2024-01-20 MED ORDER — SUGAMMADEX SODIUM 200 MG/2ML IV SOLN
INTRAVENOUS | Status: DC | PRN
  Administered 2024-01-20: 400 mg via INTRAVENOUS

## 2024-01-20 MED ORDER — LIDOCAINE 2% (20 MG/ML) 5 ML SYRINGE
INTRAMUSCULAR | Status: AC
  Filled 2024-01-20: qty 5

## 2024-01-20 MED ORDER — ONDANSETRON HCL 4 MG/2ML IJ SOLN: 4.0000 mg | Freq: Once | INTRAMUSCULAR | Status: DC | PRN

## 2024-01-20 MED ORDER — OXYCODONE HCL 5 MG/5ML PO SOLN
ORAL | Status: AC
  Filled 2024-01-20: qty 5

## 2024-01-20 MED ORDER — CEFAZOLIN SODIUM-DEXTROSE 2-4 GM/100ML-% IV SOLN
2.0000 g | INTRAVENOUS | Status: AC
  Administered 2024-01-20: 2 g via INTRAVENOUS
  Filled 2024-01-20: qty 100

## 2024-01-20 MED ORDER — OXYCODONE HCL 5 MG/5ML PO SOLN: 5.0000 mg | Freq: Once | ORAL | Status: DC | PRN

## 2024-01-20 MED ORDER — ROCURONIUM BROMIDE 10 MG/ML (PF) SYRINGE
PREFILLED_SYRINGE | INTRAVENOUS | Status: AC
  Filled 2024-01-20: qty 10

## 2024-01-20 MED ORDER — OXYCODONE HCL 5 MG PO TABS: 5.0000 mg | ORAL_TABLET | Freq: Once | ORAL | Status: DC | PRN

## 2024-01-20 MED ORDER — FENTANYL CITRATE (PF) 50 MCG/ML IJ SOSY
PREFILLED_SYRINGE | INTRAMUSCULAR | Status: AC
  Filled 2024-01-20: qty 1

## 2024-01-20 MED ORDER — LACTATED RINGERS IV SOLN: INTRAVENOUS | Status: DC

## 2024-01-20 MED ORDER — ROCURONIUM BROMIDE 10 MG/ML (PF) SYRINGE
PREFILLED_SYRINGE | INTRAVENOUS | Status: DC | PRN
  Administered 2024-01-20: 30 mg via INTRAVENOUS

## 2024-01-20 MED ORDER — CHLORHEXIDINE GLUCONATE 0.12 % MT SOLN
15.0000 mL | Freq: Once | OROMUCOSAL | Status: AC
  Administered 2024-01-20: 15 mL via OROMUCOSAL

## 2024-01-20 MED ORDER — FENTANYL CITRATE (PF) 100 MCG/2ML IJ SOLN
INTRAMUSCULAR | Status: AC
  Filled 2024-01-20: qty 2

## 2024-01-20 MED ORDER — STERILE WATER FOR IRRIGATION IR SOLN
Status: DC | PRN
  Administered 2024-01-20: 1000 mL

## 2024-01-20 MED ORDER — DEXAMETHASONE SOD PHOSPHATE PF 10 MG/ML IJ SOLN
INTRAMUSCULAR | Status: AC
  Filled 2024-01-20: qty 1

## 2024-01-20 MED ORDER — PROPOFOL 10 MG/ML IV BOLUS
INTRAVENOUS | Status: DC | PRN
  Administered 2024-01-20: 200 mg via INTRAVENOUS

## 2024-01-20 MED ORDER — ONDANSETRON HCL 4 MG/2ML IJ SOLN
INTRAMUSCULAR | Status: AC
  Filled 2024-01-20: qty 2

## 2024-01-20 MED ORDER — LIDOCAINE HCL (CARDIAC) PF 100 MG/5ML IV SOSY
PREFILLED_SYRINGE | INTRAVENOUS | Status: DC | PRN
  Administered 2024-01-20: 100 mg via INTRATRACHEAL

## 2024-01-20 MED ORDER — ONDANSETRON HCL 4 MG/2ML IJ SOLN
INTRAMUSCULAR | Status: DC | PRN
  Administered 2024-01-20: 4 mg via INTRAVENOUS

## 2024-01-20 MED ORDER — FENTANYL CITRATE (PF) 100 MCG/2ML IJ SOLN
INTRAMUSCULAR | Status: DC | PRN
  Administered 2024-01-20 (×2): 50 ug via INTRAVENOUS

## 2024-01-20 NOTE — Discharge Instructions (Signed)
 Plan: Patient is be discharged home and followup in 5 days for foley catheter removal and pathology discussion.

## 2024-01-20 NOTE — Anesthesia Preprocedure Evaluation (Signed)
 Anesthesia Evaluation  Patient identified by MRN, date of birth, ID band Patient awake    Reviewed: Allergy & Precautions, H&P , NPO status , Patient's Chart, lab work & pertinent test results, reviewed documented beta blocker date and time   Airway Mallampati: II  TM Distance: >3 FB Neck ROM: full    Dental no notable dental hx.    Pulmonary neg pulmonary ROS, shortness of breath, sleep apnea , pneumonia, COPD, Current Smoker and Patient abstained from smoking.   Pulmonary exam normal breath sounds clear to auscultation       Cardiovascular Exercise Tolerance: Good hypertension, + CAD, + Peripheral Vascular Disease and + DOE  negative cardio ROS + dysrhythmias  Rhythm:regular Rate:Normal     Neuro/Psych  PSYCHIATRIC DISORDERS Anxiety     CVA negative neurological ROS  negative psych ROS   GI/Hepatic negative GI ROS, Neg liver ROS,GERD  ,,  Endo/Other  negative endocrine ROS    Renal/GU Renal diseasenegative Renal ROS  negative genitourinary   Musculoskeletal   Abdominal   Peds  Hematology negative hematology ROS (+)   Anesthesia Other Findings   Reproductive/Obstetrics negative OB ROS                              Anesthesia Physical Anesthesia Plan  ASA: 3  Anesthesia Plan: General and General LMA   Post-op Pain Management:    Induction:   PONV Risk Score and Plan: Ondansetron   Airway Management Planned:   Additional Equipment:   Intra-op Plan:   Post-operative Plan:   Informed Consent: I have reviewed the patients History and Physical, chart, labs and discussed the procedure including the risks, benefits and alternatives for the proposed anesthesia with the patient or authorized representative who has indicated his/her understanding and acceptance.     Dental Advisory Given  Plan Discussed with: CRNA  Anesthesia Plan Comments:         Anesthesia Quick  Evaluation

## 2024-01-20 NOTE — Transfer of Care (Signed)
 Immediate Anesthesia Transfer of Care Note  Patient: Mark Waller  Procedure(s) Performed: TURBT (TRANSURETHRAL RESECTION OF BLADDER TUMOR) (Bladder)  Patient Location: PACU  Anesthesia Type:General  Level of Consciousness: awake, alert , oriented, and patient cooperative  Airway & Oxygen Therapy: Patient Spontanous Breathing and Patient connected to face mask oxygen  Post-op Assessment: Report given to RN and Post -op Vital signs reviewed and stable  Post vital signs: Reviewed and stable  Last Vitals:  Vitals Value Taken Time  BP 176/84   Temp 97.9   Pulse 47   Resp 16   SpO2 100     Last Pain:  Vitals:   01/20/24 0804  TempSrc: Oral  PainSc: 0-No pain      Patients Stated Pain Goal: 5 (01/20/24 0804)  Complications: No notable events documented.

## 2024-01-20 NOTE — Op Note (Signed)
.  Preoperative diagnosis: history of bladder cancer  Postoperative diagnosis: bladder tumor  Procedure: 1 cystoscopy 2.  Transurethral resection of bladder tumor, small  Attending: Belvie Standing  Anesthesia: General  Estimated blood loss: Minimal  Drains: 18 French foley  Specimens: bladder tumor  Antibiotics: ancef   Findings: right lateral wall sessile tumor at previous resection site, 1cm.  Ureteral orifices in normal anatomic location.   Indications: Patient is a 64 year old male with a history of bladder bladder cancer and gross hematuria.  After discussing treatment options, they decided proceed with cystoscopy and possible transurethral resection of a bladder tumor.  Procedure in detail: The patient was brought to the operating room and a brief timeout was done to ensure correct patient, correct procedure, correct site.  General anesthesia was administered patient was placed in dorsal lithotomy position.  Their genitalia was then prepped and draped in usual sterile fashion.  A rigid 22 French cystoscope was passed in the urethra and the bladder.  Bladder was inspected and we noted a 1cm bladder tumor.  the ureteral orifices were in the normal orthotopic locations.We then removed the cystoscope and placed a resectoscope into the bladder. Using the bipolar resectoscope we removed the bladder tumor down to the base. Hemostasis was then obtained with electrocautery. We then removed the bladder tumor chips and sent them for pathology. We then re-inspected the bladder and found no residula bleeding.  the bladder was then drained, a 18 French foley was placed and this concluded the procedure which was well tolerated by patient.  Complications: None  Condition: Stable, extubated, transferred to PACU  Plan: Patient is be discharged home and followup in 5 days for foley catheter removal and pathology discussion.

## 2024-01-20 NOTE — Anesthesia Procedure Notes (Signed)
 Procedure Name: LMA Insertion Date/Time: 01/20/2024 9:14 AM  Performed by: Pheobe Adine CROME, CRNAPre-anesthesia Checklist: Patient identified, Emergency Drugs available, Suction available, Patient being monitored and Timeout performed Patient Re-evaluated:Patient Re-evaluated prior to induction Oxygen Delivery Method: Circle system utilized Preoxygenation: Pre-oxygenation with 100% oxygen Induction Type: IV induction LMA: LMA inserted LMA Size: 5.0 Number of attempts: 1 Placement Confirmation: positive ETCO2, CO2 detector and breath sounds checked- equal and bilateral Tube secured with: Tape Dental Injury: Teeth and Oropharynx as per pre-operative assessment
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
## 2024-01-21 ENCOUNTER — Encounter (HOSPITAL_COMMUNITY): Payer: Self-pay | Admitting: Urology

## 2024-01-21 LAB — SURGICAL PATHOLOGY

## 2024-01-21 NOTE — Anesthesia Postprocedure Evaluation (Signed)
"   Anesthesia Post Note  Patient: Mark Waller  Procedure(s) Performed: TURBT (TRANSURETHRAL RESECTION OF BLADDER TUMOR) (Bladder)  Patient location during evaluation: Phase II Anesthesia Type: General Level of consciousness: awake Pain management: pain level controlled Vital Signs Assessment: post-procedure vital signs reviewed and stable Respiratory status: spontaneous breathing and respiratory function stable Cardiovascular status: blood pressure returned to baseline and stable Postop Assessment: no headache and no apparent nausea or vomiting Anesthetic complications: no Comments: Late entry   No notable events documented.   Last Vitals:  Vitals:   01/20/24 1036 01/20/24 1044  BP:  (!) 148/89  Pulse: (!) 44 (!) 43  Resp: 13   Temp:  36.5 C  SpO2: 91% 94%    Last Pain:  Vitals:   01/20/24 1044  TempSrc: Oral  PainSc: 5                  Mark Waller      "

## 2024-01-26 ENCOUNTER — Ambulatory Visit: Admitting: Urology

## 2024-01-26 ENCOUNTER — Telehealth: Payer: Self-pay

## 2024-01-26 ENCOUNTER — Other Ambulatory Visit: Payer: Self-pay

## 2024-01-26 VITALS — BP 103/60 | HR 56

## 2024-01-26 DIAGNOSIS — Z8551 Personal history of malignant neoplasm of bladder: Secondary | ICD-10-CM

## 2024-01-26 DIAGNOSIS — C61 Malignant neoplasm of prostate: Secondary | ICD-10-CM

## 2024-01-26 DIAGNOSIS — C679 Malignant neoplasm of bladder, unspecified: Secondary | ICD-10-CM

## 2024-01-26 MED ORDER — SULFAMETHOXAZOLE-TRIMETHOPRIM 800-160 MG PO TABS: 1.0000 | ORAL_TABLET | Freq: Once | ORAL | 0 refills | Status: AC

## 2024-01-26 NOTE — Progress Notes (Signed)
 Fill and Pull Catheter Removal  Patient is present today for a catheter removal due to post bladder biopsy.  of sterile water  was instilled into the bladder when the patient felt the urge to urinate. 10ml of water  was then drained from the balloon.  A 18FR foley cath was removed from the bladder no complications were noted .  Foley catheter intact and time of removal. Patient as then given some time to void on their own.  Patient can void  90ml on their own after some time.  Patient tolerated well.  One oral prophylactic antibiotic given per MD orders  Performed by: Veleria, CMA  Follow up/ Additional notes: MD to see after
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
## 2024-01-27 ENCOUNTER — Encounter: Payer: Self-pay | Admitting: Urology

## 2024-01-27 NOTE — Patient Instructions (Signed)

## 2024-01-31 ENCOUNTER — Ambulatory Visit (HOSPITAL_COMMUNITY)

## 2024-02-02 ENCOUNTER — Ambulatory Visit: Admitting: Internal Medicine

## 2024-02-02 DIAGNOSIS — R911 Solitary pulmonary nodule: Secondary | ICD-10-CM

## 2024-02-02 DIAGNOSIS — J449 Chronic obstructive pulmonary disease, unspecified: Secondary | ICD-10-CM
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
## 2024-04-13 ENCOUNTER — Other Ambulatory Visit

## 2024-04-19 ENCOUNTER — Ambulatory Visit: Admitting: Urology
